# Patient Record
Sex: Female | Born: 1985 | Race: White | Hispanic: No | Marital: Married | Smoking: Never smoker
Health system: Southern US, Community
[De-identification: ages and names within clinical notes are randomized; demographics above are authoritative.]

## PROBLEM LIST (undated history)

## (undated) ENCOUNTER — Inpatient Hospital Stay (HOSPITAL_COMMUNITY): Payer: Self-pay

## (undated) DIAGNOSIS — R112 Nausea with vomiting, unspecified: Secondary | ICD-10-CM

## (undated) DIAGNOSIS — O1400 Mild to moderate pre-eclampsia, unspecified trimester: Secondary | ICD-10-CM

## (undated) DIAGNOSIS — Z9889 Other specified postprocedural states: Secondary | ICD-10-CM

## (undated) DIAGNOSIS — F419 Anxiety disorder, unspecified: Secondary | ICD-10-CM

## (undated) DIAGNOSIS — O139 Gestational [pregnancy-induced] hypertension without significant proteinuria, unspecified trimester: Secondary | ICD-10-CM

## (undated) HISTORY — PX: NOSE SURGERY: SHX723

## (undated) HISTORY — PX: BREAST REDUCTION SURGERY: SHX8

## (undated) HISTORY — PX: SHOULDER SURGERY: SHX246

## (undated) HISTORY — PX: WISDOM TOOTH EXTRACTION: SHX21

## (undated) SURGERY — Surgical Case
Anesthesia: *Unknown

---

## 2003-10-08 ENCOUNTER — Other Ambulatory Visit: Admission: RE | Admit: 2003-10-08 | Discharge: 2003-10-08 | Payer: Self-pay | Admitting: Internal Medicine

## 2005-03-16 ENCOUNTER — Other Ambulatory Visit: Admission: RE | Admit: 2005-03-16 | Discharge: 2005-03-16 | Payer: Self-pay | Admitting: Internal Medicine

## 2006-01-04 ENCOUNTER — Encounter: Admission: RE | Admit: 2006-01-04 | Discharge: 2006-01-04 | Payer: Self-pay | Admitting: Family Medicine

## 2007-07-12 ENCOUNTER — Other Ambulatory Visit: Admission: RE | Admit: 2007-07-12 | Discharge: 2007-07-12 | Payer: Self-pay | Admitting: Obstetrics and Gynecology

## 2008-07-28 ENCOUNTER — Other Ambulatory Visit: Admission: RE | Admit: 2008-07-28 | Discharge: 2008-07-28 | Payer: Self-pay | Admitting: Obstetrics and Gynecology

## 2009-01-14 ENCOUNTER — Inpatient Hospital Stay (HOSPITAL_COMMUNITY): Admission: AD | Admit: 2009-01-14 | Discharge: 2009-01-14 | Payer: Self-pay | Admitting: Obstetrics and Gynecology

## 2009-03-10 ENCOUNTER — Inpatient Hospital Stay (HOSPITAL_COMMUNITY): Admission: AD | Admit: 2009-03-10 | Discharge: 2009-03-10 | Payer: Self-pay | Admitting: Obstetrics and Gynecology

## 2009-04-05 ENCOUNTER — Inpatient Hospital Stay (HOSPITAL_COMMUNITY): Admission: AD | Admit: 2009-04-05 | Discharge: 2009-04-08 | Payer: Self-pay | Admitting: Obstetrics and Gynecology

## 2010-07-04 ENCOUNTER — Encounter: Payer: Self-pay | Admitting: Family Medicine

## 2010-09-16 LAB — COMPREHENSIVE METABOLIC PANEL
BUN: 6 mg/dL (ref 6–23)
CO2: 22 mEq/L (ref 19–32)
Calcium: 9.4 mg/dL (ref 8.4–10.5)
Creatinine, Ser: 0.67 mg/dL (ref 0.4–1.2)
GFR calc Af Amer: >60 mL/min (ref >60)
GFR calc non Af Amer: >60 mL/min (ref >60)
Sodium: 136 mEq/L (ref 135–145)

## 2010-09-16 LAB — CBC
Hemoglobin: 12.7 g/dL (ref 12.0–15.0)
MCV: 92.6 fL (ref 78.0–100.0)
MCV: 93.3 fL (ref 78.0–100.0)
RBC: 4.09 MIL/uL (ref 3.87–5.11)
WBC: 8.6 10*3/uL (ref 4.0–10.5)

## 2012-01-24 ENCOUNTER — Inpatient Hospital Stay (HOSPITAL_COMMUNITY)
Admission: AD | Admit: 2012-01-24 | Discharge: 2012-01-24 | Disposition: A | Discharge status: Home / Self Care | Payer: 59 | Source: Ambulatory Visit | Attending: Obstetrics and Gynecology | Admitting: Obstetrics and Gynecology

## 2012-01-24 ENCOUNTER — Ambulatory Visit (HOSPITAL_COMMUNITY): Payer: 59

## 2012-01-24 ENCOUNTER — Encounter (HOSPITAL_COMMUNITY): Payer: Self-pay | Admitting: *Deleted

## 2012-01-24 DIAGNOSIS — O99891 Other specified diseases and conditions complicating pregnancy: Secondary | ICD-10-CM | POA: Insufficient documentation

## 2012-01-24 DIAGNOSIS — R1031 Right lower quadrant pain: Secondary | ICD-10-CM | POA: Insufficient documentation

## 2012-01-24 DIAGNOSIS — K37 Unspecified appendicitis: Secondary | ICD-10-CM

## 2012-01-24 HISTORY — DX: Gestational (pregnancy-induced) hypertension without significant proteinuria, unspecified trimester: O13.9

## 2012-01-24 LAB — URINALYSIS, ROUTINE W REFLEX MICROSCOPIC
Ketones, ur: NEGATIVE mg/dL
Leukocytes, UA: NEGATIVE
Nitrite: NEGATIVE
Protein, ur: NEGATIVE mg/dL
pH: 5.5 (ref 5.0–8.0)

## 2012-01-24 LAB — CBC WITH DIFFERENTIAL/PLATELET
Basophils Absolute: 0 10*3/uL (ref 0.0–0.1)
Eosinophils Relative: 0 % (ref 0–5)
Hemoglobin: 13.3 g/dL (ref 12.0–15.0)
Lymphocytes Relative: 4 % — ABNORMAL LOW (ref 12–46)
Lymphs Abs: 0.7 10*3/uL (ref 0.7–4.0)
MCH: 31.1 pg (ref 26.0–34.0)
MCHC: 36 g/dL (ref 30.0–36.0)
MCV: 86.4 fL (ref 78.0–100.0)
Monocytes Absolute: 0.8 10*3/uL (ref 0.1–1.0)
Neutrophils Relative %: 91 % — ABNORMAL HIGH (ref 43–77)
RBC: 4.27 MIL/uL (ref 3.87–5.11)
WBC: 15.7 10*3/uL — ABNORMAL HIGH (ref 4.0–10.5)

## 2012-01-24 LAB — COMPREHENSIVE METABOLIC PANEL
ALT: 10 U/L (ref 0–35)
BUN: 8 mg/dL (ref 6–23)
Calcium: 10.1 mg/dL (ref 8.4–10.5)
Potassium: 3.8 mEq/L (ref 3.5–5.1)
Total Bilirubin: 0.4 mg/dL (ref 0.3–1.2)

## 2012-01-24 LAB — POCT PREGNANCY, URINE: Preg Test, Ur: POSITIVE — AB

## 2012-01-24 MED ORDER — ONDANSETRON HCL 4 MG/2ML IJ SOLN
4.0000 mg | Freq: Once | INTRAMUSCULAR | Status: AC
  Administered 2012-01-24: 4 mg via INTRAVENOUS
  Filled 2012-01-24: qty 2

## 2012-01-24 MED ORDER — LACTATED RINGERS IV SOLN
INTRAVENOUS | Status: DC
  Administered 2012-01-24: 09:00:00 via INTRAVENOUS

## 2012-01-24 MED ORDER — HYDROMORPHONE HCL PF 1 MG/ML IJ SOLN
1.0000 mg | INTRAMUSCULAR | Status: DC | PRN
  Administered 2012-01-24: 1 mg via INTRAVENOUS
  Filled 2012-01-24: qty 1

## 2012-01-24 NOTE — Progress Notes (Signed)
I have reviewed the MAU note by CNM. C/o severe RLQ pain since about 0400, n/v, chills. She is afebrile but obviously uncomfortable Abd-soft, tender with rebound in RLQ IUP at 9+ weeks, RLQ pain suspicious for appendicitis.  I have spoken with Dr. Jamey Ripa and Dr. Eppie Gibson.  Will get CBC, CMP, send for MRI to look at appendix.  IV Dilaudid prn for now.

## 2012-01-24 NOTE — MAU Note (Signed)
Somewhat nauseated, had been constipated, did go prior to onset.

## 2012-01-24 NOTE — MAU Provider Note (Signed)
Hannah Hill y.o.G2P1001 @[redacted]w[redacted]d  by LMP Chief Complaint  Patient presents with  . Abdominal Pain     First Provider Initiated Contact with Patient 01/24/12 0720      SUBJECTIVE  HPI: Pt presents to MAU with RLQ sharp pain and diffuse constant pain radiating from RLQ across lower abdomen.  She woke up at midnight with indigestion severe enough she could not sleep, took tums, and tried to use the restroom.  Her pain became progressively worse over the next few hours despite trying a bath and Pepcid.  Upon arrival in MAU, she reports nausea and chills and rates her abdominal pain 9/10.  She has had one prenatal visit in this pregnancy with a  confirmation ultrasound which she reports was normal 1 week ago. She denies vaginal bleeding, vaginal itching/burning, urinary symptoms, h/a, or dizziness.  Past Medical History  Diagnosis Date  . Pregnancy induced hypertension    Past Surgical History  Procedure Date  . Wisdom tooth extraction   . Nose surgery    History   Social History  . Marital Status: Married    Spouse Name: N/A    Number of Children: N/A  . Years of Education: N/A   Occupational History  . Not on file.   Social History Main Topics  . Smoking status: Never Smoker   . Smokeless tobacco: Not on file  . Alcohol Use:   . Drug Use: No  . Sexually Active: Yes   Other Topics Concern  . Not on file   Social History Narrative  . No narrative on file   No current facility-administered medications on file prior to encounter.   Current Outpatient Prescriptions on File Prior to Encounter  Medication Sig Dispense Refill  . famotidine (PEPCID) 20 MG tablet Take 20 mg by mouth 2 (two) times daily.       Allergies  Allergen Reactions  . Amoxicillin Anaphylaxis  . Betadine (Povidone Iodine) Rash    ROS: Pertinent items in HPI  OBJECTIVE Blood pressure 125/76, pulse 89, temperature 98.2 F (36.8 C), temperature source Oral, resp. rate 20, height 5\' 4"  (1.626 m),  weight 63.504 kg (140 lb).  GENERAL: Well-developed, well-nourished female in no acute distress.  HEENT: Normocephalic, good dentition HEART: normal rate RESP: normal effort ABDOMEN: Soft, gravid, negative guarding, positive rebound tenderness in RLQ, positive mild tenderness generalized across lower abdomen EXTREMITIES: Nontender, no edema NEURO: Alert and oriented  Neg CVA tenderness  LAB RESULTS  Results for orders placed during the hospital encounter of 01/24/12 (from the past 24 hour(s))  URINALYSIS, ROUTINE W REFLEX MICROSCOPIC     Status: Abnormal   Collection Time   01/24/12  6:40 AM      Component Value Range   Color, Urine YELLOW  YELLOW   APPearance CLEAR  CLEAR   Specific Gravity, Urine >1.030 (*) 1.005 - 1.030   pH 5.5  5.0 - 8.0   Glucose, UA NEGATIVE  NEGATIVE mg/dL   Hgb urine dipstick NEGATIVE  NEGATIVE   Bilirubin Urine NEGATIVE  NEGATIVE   Ketones, ur NEGATIVE  NEGATIVE mg/dL   Protein, ur NEGATIVE  NEGATIVE mg/dL   Urobilinogen, UA 0.2  0.0 - 1.0 mg/dL   Nitrite NEGATIVE  NEGATIVE   Leukocytes, UA NEGATIVE  NEGATIVE  POCT PREGNANCY, URINE     Status: Abnormal   Collection Time   01/24/12  7:00 AM      Component Value Range   Preg Test, Ur POSITIVE (*) NEGATIVE  Medication List  As of 01/24/2012  7:28 AM   ASK your doctor about these medications         calcium carbonate 500 MG chewable tablet   Commonly known as: TUMS - dosed in mg elemental calcium   Chew 2 tablets by mouth 3 (three) times daily.      famotidine 20 MG tablet   Commonly known as: PEPCID   Take 20 mg by mouth 2 (two) times daily.      PRENATAL VITAMIN PO   Take by mouth.            Called Dr Jackelyn Knife to discuss assessment and findings Care assumed by Dr Jackelyn Knife at this time  LEFTWICH-KIRBY, [facility] 01/24/2012 7:28 AM

## 2012-01-24 NOTE — MAU Note (Signed)
Return via [facility]link from [facility].   Reviewed results.  Pt throwing up when arrived, pt states had thrown up during the night; upon arrival to radiology and now.

## 2012-01-24 NOTE — MAU Note (Signed)
Has had confirmation (IUP) preg.  Pain is in lower quadrant- up to midline.  Did have something like this with first pregnancy, not this severe.

## 2012-01-24 NOTE — MAU Note (Signed)
Pt and spouse aware of need for transfer to [facility] for MRI.  Dr Jackelyn Knife has explained that they will read the test, if has appendicitis she will stay at [facility], if it is negative she will return to [facility]. Pt and spouse verbalized understanding of plan.

## 2012-01-24 NOTE — MAU Note (Signed)
Abdominal pain that started around midnight. Started feeling like indigestion but now its just sharp pain. Feeling like the need to vomit. Muscle aches and chills.

## 2012-02-07 LAB — OB RESULTS CONSOLE GC/CHLAMYDIA: Chlamydia: NEGATIVE

## 2012-06-13 NOTE — L&D Delivery Note (Signed)
Delivery Note At 8:46 PM a viable and healthy female was delivered via Vaginal, Spontaneous Delivery (Presentation:OA, ROT ).  APGAR:8 ,9 ; weight P.   Placenta status:delivered, intact.  Cord: 3VC with the following complications: no.    Anesthesia: Epidural  Episiotomy: no Lacerations: 2nd degree perineal Suture Repair: 3.0 vicryl rapide Est. Blood Loss (mL): 400cc  Mom to postpartum.  Baby to stay with mommy and daddy.  BOVARD,Brysun Eschmann 08/08/2012, 9:16 PM  Br/O+/IUD  D/w pt circumcision for female infant inc r/b/a, wish to proceed

## 2012-08-07 ENCOUNTER — Other Ambulatory Visit: Payer: Self-pay | Admitting: Obstetrics and Gynecology

## 2012-08-07 ENCOUNTER — Encounter (HOSPITAL_COMMUNITY): Payer: Self-pay

## 2012-08-07 DIAGNOSIS — O1400 Mild to moderate pre-eclampsia, unspecified trimester: Secondary | ICD-10-CM

## 2012-08-07 HISTORY — DX: Mild to moderate pre-eclampsia, unspecified trimester: O14.00

## 2012-08-07 MED ORDER — PRENATAL MULTIVITAMIN CH
1.0000 | ORAL_TABLET | Freq: Every day | ORAL | Status: DC
  Filled 2012-08-07: qty 1

## 2012-08-07 MED ORDER — FAMOTIDINE 20 MG PO TABS
20.0000 mg | ORAL_TABLET | Freq: Every day | ORAL | Status: DC
  Administered 2012-08-09 – 2012-08-10 (×2): 20 mg via ORAL
  Filled 2012-08-07 (×2): qty 1

## 2012-08-07 MED ORDER — CALCIUM CARBONATE ANTACID 500 MG PO CHEW: 2.0000 | CHEWABLE_TABLET | Freq: Every day | ORAL | Status: DC | PRN
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
## 2012-08-08 ENCOUNTER — Inpatient Hospital Stay (HOSPITAL_COMMUNITY)
Admission: RE | Admit: 2012-08-08 | Discharge: 2012-08-10 | DRG: 774 | Disposition: A | Discharge status: Home / Self Care | Payer: 59 | Source: Ambulatory Visit | Attending: Obstetrics and Gynecology | Admitting: Obstetrics and Gynecology

## 2012-08-08 ENCOUNTER — Inpatient Hospital Stay (HOSPITAL_COMMUNITY): Payer: 59 | Admitting: Anesthesiology

## 2012-08-08 ENCOUNTER — Encounter (HOSPITAL_COMMUNITY): Payer: Self-pay

## 2012-08-08 ENCOUNTER — Encounter (HOSPITAL_COMMUNITY): Payer: Self-pay | Admitting: Anesthesiology

## 2012-08-08 VITALS — BP 104/70 | HR 81 | Temp 97.8°F | Resp 18 | Ht 64.0 in | Wt 185.0 lb

## 2012-08-08 DIAGNOSIS — O1400 Mild to moderate pre-eclampsia, unspecified trimester: Secondary | ICD-10-CM | POA: Diagnosis not present

## 2012-08-08 DIAGNOSIS — IMO0002 Reserved for concepts with insufficient information to code with codable children: Principal | ICD-10-CM | POA: Diagnosis present

## 2012-08-08 HISTORY — DX: Mild to moderate pre-eclampsia, unspecified trimester: O14.00

## 2012-08-08 HISTORY — DX: Nausea with vomiting, unspecified: Z98.890

## 2012-08-08 HISTORY — DX: Other specified postprocedural states: Z98.890

## 2012-08-08 HISTORY — DX: Other specified postprocedural states: R11.2

## 2012-08-08 LAB — CBC
HCT: 34 % — ABNORMAL LOW (ref 36.0–46.0)
HCT: 33.4 % — ABNORMAL LOW (ref 36.0–46.0)
Hemoglobin: 11.6 g/dL — ABNORMAL LOW (ref 12.0–15.0)
Hemoglobin: 11.2 g/dL — ABNORMAL LOW (ref 12.0–15.0)
MCH: 28.9 pg (ref 26.0–34.0)
MCHC: 33.7 g/dL (ref 30.0–36.0)
MCV: 86.1 fL (ref 78.0–100.0)
RBC: 4 MIL/uL (ref 3.87–5.11)
RBC: 3.88 MIL/uL (ref 3.87–5.11)
RDW: 12.7 % (ref 11.5–15.5)
WBC: 7.4 10*3/uL (ref 4.0–10.5)
WBC: 8.6 10*3/uL (ref 4.0–10.5)

## 2012-08-08 LAB — COMPREHENSIVE METABOLIC PANEL
CO2: 20 mEq/L (ref 19–32)
Calcium: 9 mg/dL (ref 8.4–10.5)
Creatinine, Ser: 0.5 mg/dL (ref 0.50–1.10)
GFR calc Af Amer: >90 mL/min (ref >90)
GFR calc non Af Amer: >90 mL/min (ref >90)
Glucose, Bld: 69 mg/dL — ABNORMAL LOW (ref 70–99)
Total Protein: 6.5 g/dL (ref 6.0–8.3)

## 2012-08-08 LAB — OB RESULTS CONSOLE GBS: GBS: NEGATIVE

## 2012-08-08 LAB — OB RESULTS CONSOLE ANTIBODY SCREEN: Antibody Screen: NEGATIVE

## 2012-08-08 LAB — RPR: RPR Ser Ql: NONREACTIVE

## 2012-08-08 MED ORDER — TETANUS-DIPHTH-ACELL PERTUSSIS 5-2.5-18.5 LF-MCG/0.5 IM SUSP: 0.5000 mL | Freq: Once | INTRAMUSCULAR | Status: DC

## 2012-08-08 MED ORDER — SENNOSIDES-DOCUSATE SODIUM 8.6-50 MG PO TABS
2.0000 | ORAL_TABLET | Freq: Every day | ORAL | Status: DC
  Administered 2012-08-09: 2 via ORAL

## 2012-08-08 MED ORDER — EPHEDRINE 5 MG/ML INJ
10.0000 mg | INTRAVENOUS | Status: DC | PRN
  Filled 2012-08-08: qty 4

## 2012-08-08 MED ORDER — LACTATED RINGERS IV SOLN: INTRAVENOUS | Status: DC

## 2012-08-08 MED ORDER — PHENYLEPHRINE 40 MCG/ML (10ML) SYRINGE FOR IV PUSH (FOR BLOOD PRESSURE SUPPORT): 80.0000 ug | PREFILLED_SYRINGE | INTRAVENOUS | Status: DC | PRN

## 2012-08-08 MED ORDER — ACETAMINOPHEN 325 MG PO TABS: 650.0000 mg | ORAL_TABLET | ORAL | Status: DC | PRN

## 2012-08-08 MED ORDER — EPHEDRINE 5 MG/ML INJ: 10.0000 mg | INTRAVENOUS | Status: DC | PRN

## 2012-08-08 MED ORDER — IBUPROFEN 600 MG PO TABS
600.0000 mg | ORAL_TABLET | Freq: Four times a day (QID) | ORAL | Status: DC
  Administered 2012-08-09 – 2012-08-10 (×6): 600 mg via ORAL
  Filled 2012-08-08 (×6): qty 1

## 2012-08-08 MED ORDER — ONDANSETRON HCL 4 MG PO TABS: 4.0000 mg | ORAL_TABLET | ORAL | Status: DC | PRN

## 2012-08-08 MED ORDER — LIDOCAINE HCL (PF) 1 % IJ SOLN
INTRAMUSCULAR | Status: DC | PRN
  Administered 2012-08-08 (×2): 5 mL

## 2012-08-08 MED ORDER — BUTORPHANOL TARTRATE 1 MG/ML IJ SOLN
2.0000 mg | INTRAMUSCULAR | Status: DC | PRN
  Administered 2012-08-08: 2 mg via INTRAVENOUS
  Filled 2012-08-08: qty 2

## 2012-08-08 MED ORDER — SIMETHICONE 80 MG PO CHEW: 80.0000 mg | CHEWABLE_TABLET | ORAL | Status: DC | PRN

## 2012-08-08 MED ORDER — LACTATED RINGERS IV SOLN
500.0000 mL | Freq: Once | INTRAVENOUS | Status: AC
  Administered 2012-08-08: 500 mL via INTRAVENOUS

## 2012-08-08 MED ORDER — ONDANSETRON HCL 4 MG/2ML IJ SOLN: 4.0000 mg | INTRAMUSCULAR | Status: DC | PRN

## 2012-08-08 MED ORDER — DIBUCAINE 1 % RE OINT
1.0000 "application " | TOPICAL_OINTMENT | RECTAL | Status: DC | PRN
  Administered 2012-08-10: 1 via RECTAL
  Filled 2012-08-08: qty 28

## 2012-08-08 MED ORDER — ONDANSETRON HCL 4 MG/2ML IJ SOLN
4.0000 mg | Freq: Four times a day (QID) | INTRAMUSCULAR | Status: DC | PRN
  Administered 2012-08-08: 4 mg via INTRAVENOUS
  Filled 2012-08-08: qty 2

## 2012-08-08 MED ORDER — SODIUM CHLORIDE 0.9 % IV SOLN
INTRAVENOUS | Status: DC
  Filled 2012-08-08 (×3): qty 25

## 2012-08-08 MED ORDER — IBUPROFEN 600 MG PO TABS: 600.0000 mg | ORAL_TABLET | Freq: Four times a day (QID) | ORAL | Status: DC | PRN

## 2012-08-08 MED ORDER — WITCH HAZEL-GLYCERIN EX PADS
1.0000 "application " | MEDICATED_PAD | CUTANEOUS | Status: DC | PRN
  Administered 2012-08-10: 1 via TOPICAL

## 2012-08-08 MED ORDER — DIPHENHYDRAMINE HCL 50 MG/ML IJ SOLN: 12.5000 mg | INTRAMUSCULAR | Status: DC | PRN

## 2012-08-08 MED ORDER — LIDOCAINE HCL (PF) 1 % IJ SOLN
30.0000 mL | INTRAMUSCULAR | Status: DC | PRN
  Filled 2012-08-08: qty 30

## 2012-08-08 MED ORDER — BENZOCAINE-MENTHOL 20-0.5 % EX AERO
1.0000 "application " | INHALATION_SPRAY | CUTANEOUS | Status: DC | PRN
  Administered 2012-08-09: 1 via TOPICAL
  Filled 2012-08-08: qty 56

## 2012-08-08 MED ORDER — PHENYLEPHRINE 40 MCG/ML (10ML) SYRINGE FOR IV PUSH (FOR BLOOD PRESSURE SUPPORT)
80.0000 ug | PREFILLED_SYRINGE | INTRAVENOUS | Status: DC | PRN
  Filled 2012-08-08: qty 5

## 2012-08-08 MED ORDER — DIPHENHYDRAMINE HCL 25 MG PO CAPS: 25.0000 mg | ORAL_CAPSULE | Freq: Four times a day (QID) | ORAL | Status: DC | PRN

## 2012-08-08 MED ORDER — OXYTOCIN 40 UNITS IN LACTATED RINGERS INFUSION - SIMPLE MED: 62.5000 mL/h | INTRAVENOUS | Status: DC

## 2012-08-08 MED ORDER — PRENATAL MULTIVITAMIN CH
1.0000 | ORAL_TABLET | Freq: Every day | ORAL | Status: DC
  Administered 2012-08-09 – 2012-08-10 (×2): 1 via ORAL
  Filled 2012-08-08: qty 1

## 2012-08-08 MED ORDER — OXYCODONE-ACETAMINOPHEN 5-325 MG PO TABS: 1.0000 | ORAL_TABLET | ORAL | Status: DC | PRN

## 2012-08-08 MED ORDER — LACTATED RINGERS IV SOLN
INTRAVENOUS | Status: DC
  Administered 2012-08-08 (×2): via INTRAVENOUS

## 2012-08-08 MED ORDER — FENTANYL 2.5 MCG/ML BUPIVACAINE 1/10 % EPIDURAL INFUSION (WH - ANES)
14.0000 mL/h | INTRAMUSCULAR | Status: DC
  Administered 2012-08-08 (×2): 14 mL/h via EPIDURAL
  Filled 2012-08-08: qty 125

## 2012-08-08 MED ORDER — ZOLPIDEM TARTRATE 5 MG PO TABS: 5.0000 mg | ORAL_TABLET | Freq: Every evening | ORAL | Status: DC | PRN

## 2012-08-08 MED ORDER — LANOLIN HYDROUS EX OINT: TOPICAL_OINTMENT | CUTANEOUS | Status: DC | PRN

## 2012-08-08 MED ORDER — TERBUTALINE SULFATE 1 MG/ML IJ SOLN: 0.2500 mg | Freq: Once | INTRAMUSCULAR | Status: DC | PRN

## 2012-08-08 MED ORDER — LACTATED RINGERS IV SOLN: 500.0000 mL | INTRAVENOUS | Status: DC | PRN

## 2012-08-08 MED ORDER — OXYTOCIN BOLUS FROM INFUSION: 500.0000 mL | INTRAVENOUS | Status: DC

## 2012-08-08 MED ORDER — OXYTOCIN 40 UNITS IN LACTATED RINGERS INFUSION - SIMPLE MED
1.0000 m[IU]/min | INTRAVENOUS | Status: DC
  Administered 2012-08-08: 2 m[IU]/min via INTRAVENOUS
  Filled 2012-08-08: qty 1000

## 2012-08-08 MED ORDER — CITRIC ACID-SODIUM CITRATE 334-500 MG/5ML PO SOLN: 30.0000 mL | ORAL | Status: DC | PRN

## 2012-08-08 MED ORDER — OXYCODONE-ACETAMINOPHEN 5-325 MG PO TABS
1.0000 | ORAL_TABLET | ORAL | Status: DC | PRN
  Administered 2012-08-09 – 2012-08-10 (×5): 1 via ORAL
  Filled 2012-08-08 (×3): qty 1
  Filled 2012-08-08: qty 2
  Filled 2012-08-08: qty 1

## 2012-08-08 NOTE — Progress Notes (Signed)
Patient ID: Hannah Hill, female   DOB: 10-04-1985, 27 y.o.   MRN: [id number]  H&P reviewed, no changes  AFVSS, BP 140-150/80-90 gen NAD FHTs 130's mod var toco q 4-46min SVE 1.5/50 -2-3 Unable to ROM, despite attempt  27yo G2P1001 with mild PreE for iol Will follow CBC/CMP Epidural prn AROM/pitocin to augment Expect SVD

## 2012-08-08 NOTE — Anesthesia Preprocedure Evaluation (Signed)
Anesthesia Evaluation  Patient identified by MRN, date of birth, ID band Patient awake    Reviewed: Allergy & Precautions, H&P , Patient's Chart, lab work & pertinent test results  History of Anesthesia Complications (+) PONV  Airway Mallampati: II TM Distance: >3 FB Neck ROM: full    Dental no notable dental hx.    Pulmonary neg pulmonary ROS,  breath sounds clear to auscultation  Pulmonary exam normal       Cardiovascular hypertension, negative cardio ROS  Rhythm:regular Rate:Normal     Neuro/Psych negative neurological ROS  negative psych ROS   GI/Hepatic negative GI ROS, Neg liver ROS,   Endo/Other  negative endocrine ROS  Renal/GU negative Renal ROS     Musculoskeletal   Abdominal   Peds  Hematology negative hematology ROS (+)   Anesthesia Other Findings PONV (postoperative nausea and vomiting)     Pregnancy induced hypertension        Mild preeclampsia    Reproductive/Obstetrics (+) Pregnancy                           Anesthesia Physical Anesthesia Plan  ASA: III  Anesthesia Plan: Epidural   Post-op Pain Management:    Induction:   Airway Management Planned:   Additional Equipment:   Intra-op Plan:   Post-operative Plan:   Informed Consent: I have reviewed the patients History and Physical, chart, labs and discussed the procedure including the risks, benefits and alternatives for the proposed anesthesia with the patient or authorized representative who has indicated his/her understanding and acceptance.     Plan Discussed with:   Anesthesia Plan Comments:         Anesthesia Quick Evaluation

## 2012-08-08 NOTE — Anesthesia Procedure Notes (Signed)
Epidural Patient location during procedure: OB Start time: 08/08/2012 5:51 PM  Staffing Anesthesiologist: Angus Seller., Harrell Gave. Performed by: anesthesiologist   Preanesthetic Checklist Completed: patient identified, site marked, surgical consent, pre-op evaluation, timeout performed, IV checked, risks and benefits discussed and monitors and equipment checked  Epidural Patient position: sitting Prep: ChloraPrep and site prepped and draped Patient monitoring: continuous pulse ox and blood pressure Approach: midline Injection technique: LOR air and LOR saline  Needle:  Needle type: Tuohy  Needle gauge: 17 G Needle length: 9 cm and 9 Needle insertion depth: 5 cm cm Catheter type: closed end flexible Catheter size: 19 Gauge Catheter at skin depth: 10 cm Test dose: negative  Assessment Events: blood not aspirated, injection not painful, no injection resistance, negative IV test and no paresthesia  Additional Notes Patient identified.  Risk benefits discussed including failed block, incomplete pain control, headache, nerve damage, paralysis, blood pressure changes, nausea, vomiting, reactions to medication both toxic or allergic, and postpartum back pain.  Patient expressed understanding and wished to proceed.  All questions were answered.  Sterile technique used throughout procedure and epidural site dressed with sterile barrier dressing. No paresthesia or other complications noted.The patient did not experience any signs of intravascular injection such as tinnitus or metallic taste in mouth nor signs of intrathecal spread such as rapid motor block. Please see nursing notes for vital signs.

## 2012-08-08 NOTE — Progress Notes (Signed)
Patient ID: Hannah Hill, female   DOB: [birth year], 27 y.o.   MRN: [id number]  Feeling ctx.  Doing well  AFVSS 130's/90's  FHTs 150's good var, category 1 toco q 2-3 min  AROM for clear fluid SVE 3.4/70/-2  Continue current mgmt Augment with pitocin Expect SVD Monitor BP - if have to treat, will treat with magnesium CBC q 6hr/ CMP

## 2012-08-09 LAB — CBC
MCH: 28.8 pg (ref 26.0–34.0)
MCV: 86.2 fL (ref 78.0–100.0)
Platelets: 163 10*3/uL (ref 150–400)
RDW: 12.9 % (ref 11.5–15.5)

## 2012-08-09 LAB — CCBB MATERNAL DONOR DRAW

## 2012-08-09 NOTE — Anesthesia Postprocedure Evaluation (Signed)
  Anesthesia Post-op Note  Patient: Hannah Hill  Procedure(s) Performed: * No procedures listed *  Patient Location: Mother/Baby  Anesthesia Type:Epidural  Level of Consciousness: awake  Airway and Oxygen Therapy: Patient Spontanous Breathing  Post-op Pain: none  Post-op Assessment: Patient's Cardiovascular Status Stable, Respiratory Function Stable, Patent Airway, No signs of Nausea or vomiting, Adequate PO intake, Pain level controlled, No headache, No backache, No residual numbness and No residual motor weakness  Post-op Vital Signs: Reviewed and stable  Complications: No apparent anesthesia complications

## 2012-08-09 NOTE — Progress Notes (Signed)
Post Partum Day 1 Subjective: no complaints, up ad lib, tolerating PO and nl lochia, pain controlled  Objective: Blood pressure 102/65, pulse 76, temperature 98.2 F (36.8 C), temperature source Oral, resp. rate 18, height 5\' 4"  (1.626 m), weight 83.915 kg (185 lb), SpO2 99.00%, unknown if currently breastfeeding.  Physical Exam:  General: alert and no distress Lochia: appropriate Uterine Fundus: firm    Recent Labs  08/08/12 1322 08/08/12 2000  HGB 11.6* 11.2*  HCT 34.0* 33.4*    Assessment/Plan: Plan for discharge tomorrow, Breastfeeding and Lactation consult.  Routine PP care.     LOS: 1 day   BOVARD,Jami Ohlin 08/09/2012, 6:24 AM

## 2012-08-10 MED ORDER — OXYCODONE-ACETAMINOPHEN 5-325 MG PO TABS: 1.0000 | ORAL_TABLET | Freq: Four times a day (QID) | ORAL | Status: DC | PRN

## 2012-08-10 MED ORDER — IBUPROFEN 800 MG PO TABS: 800.0000 mg | ORAL_TABLET | Freq: Four times a day (QID) | ORAL | Status: DC

## 2012-08-10 MED ORDER — PRENATAL MULTIVITAMIN CH: 1.0000 | ORAL_TABLET | Freq: Every day | ORAL | Status: AC

## 2012-08-10 NOTE — Discharge Summary (Signed)
Obstetric Discharge Summary Reason for Admission: induction of labor and PreE Prenatal Procedures: none Intrapartum Procedures: spontaneous vaginal delivery Postpartum Procedures: none Complications-Operative and Postpartum: 2nd degree perineal laceration Hemoglobin  Date Value Range Status  08/09/2012 9.4* 12.0 - 15.0 g/dL Final     HCT  Date Value Range Status  08/09/2012 28.1* 36.0 - 46.0 % Final    Physical Exam:  General: alert and no distress Lochia: appropriate Uterine Fundus: firm  Discharge Diagnoses: Term Pregnancy-delivered  Discharge Information: Date: 08/10/2012 Activity: pelvic rest Diet: routine Medications: PNV, Ibuprofen and Percocet Condition: stable Instructions: refer to practice specific booklet Discharge to: home Follow-up Information   Follow up with BOVARD,Hannah Menon, MD. Schedule an appointment as soon as possible for a visit in 6 weeks.   Contact information:   [address] [place] [address] [phone]       Newborn Data: Live born female  Birth Weight: 7 lb 9 oz (3430 g) APGAR: 8, 9  Home with mother.  BOVARD,Hannah Hill 08/10/2012, 7:52 AM

## 2012-08-10 NOTE — Progress Notes (Signed)
Post Partum Day 2 Subjective: no complaints, up ad lib, voiding, tolerating PO and nl lochia, pain controlled  Objective: Blood pressure 104/70, pulse 81, temperature 97.8 F (36.6 C), temperature source Oral, resp. rate 18, height 5\' 4"  (1.626 m), weight 83.915 kg (185 lb), SpO2 98.00%, unknown if currently breastfeeding.  Physical Exam:  General: alert and no distress Lochia: appropriate Uterine Fundus: firm   Recent Labs  08/08/12 2000 08/09/12 0545  HGB 11.2* 9.4*  HCT 33.4* 28.1*    Assessment/Plan: Discharge home, Breastfeeding and Lactation consult.  D/C with motrin/percocet/pnv.  F/u in 6 weeks   LOS: 2 days   BOVARD,Razan Siler 08/10/2012, 7:47 AM

## 2012-08-10 NOTE — Progress Notes (Signed)
Pt discharged before CSW could assess history of anxiety. 

## 2014-01-08 ENCOUNTER — Emergency Department (INDEPENDENT_AMBULATORY_CARE_PROVIDER_SITE_OTHER)
Admission: EM | Admit: 2014-01-08 | Discharge: 2014-01-08 | Disposition: A | Discharge status: Home / Self Care | Payer: 59 | Source: Home / Self Care | Attending: Family Medicine | Admitting: Family Medicine

## 2014-01-08 ENCOUNTER — Encounter (HOSPITAL_COMMUNITY): Payer: Self-pay | Admitting: Emergency Medicine

## 2014-01-08 DIAGNOSIS — B349 Viral infection, unspecified: Secondary | ICD-10-CM

## 2014-01-08 DIAGNOSIS — B9789 Other viral agents as the cause of diseases classified elsewhere: Secondary | ICD-10-CM

## 2014-01-08 LAB — POCT I-STAT, CHEM 8
BUN: 7 mg/dL (ref 6–23)
CREATININE: 0.7 mg/dL (ref 0.50–1.10)
Calcium, Ion: 1.1 mmol/L — ABNORMAL LOW (ref 1.12–1.23)
Chloride: 105 mEq/L (ref 96–112)
Glucose, Bld: 77 mg/dL (ref 70–99)
HCT: 39 % (ref 36.0–46.0)
Hemoglobin: 13.3 g/dL (ref 12.0–15.0)
POTASSIUM: 3.4 meq/L — AB (ref 3.7–5.3)
SODIUM: 137 meq/L (ref 137–147)
TCO2: 21 mmol/L (ref 0–100)

## 2014-01-08 LAB — POCT URINALYSIS DIP (DEVICE)
BILIRUBIN URINE: NEGATIVE
GLUCOSE, UA: NEGATIVE mg/dL
HGB URINE DIPSTICK: NEGATIVE
KETONES UR: NEGATIVE mg/dL
Leukocytes, UA: NEGATIVE
Nitrite: NEGATIVE
Protein, ur: NEGATIVE mg/dL
SPECIFIC GRAVITY, URINE: 1.025 (ref 1.005–1.030)
Urobilinogen, UA: 0.2 mg/dL (ref 0.0–1.0)
pH: 6 (ref 5.0–8.0)

## 2014-01-08 LAB — POCT PREGNANCY, URINE: Preg Test, Ur: NEGATIVE

## 2014-01-08 MED ORDER — DOXYCYCLINE HYCLATE 100 MG PO CAPS: 100.0000 mg | ORAL_CAPSULE | Freq: Two times a day (BID) | ORAL | Status: DC
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
## 2014-01-08 NOTE — Discharge Instructions (Signed)
Call on fri for rmsf test result, take medicine as prescribed, drink plenty of fluids

## 2014-01-08 NOTE — ED Provider Notes (Signed)
CSN: [id number]     Arrival date & time 01/08/14  1601 History   First MD Initiated Contact with Patient 01/08/14 1623     Chief Complaint  Patient presents with  . Generalized Body Aches   (Consider location/radiation/quality/duration/timing/severity/associated sxs/prior Treatment) Patient is a 28 y.o. female presenting with weakness. The history is provided by the patient.  Weakness This is a new problem. The current episode started more than 2 days ago. The problem has not changed since onset.Associated symptoms include headaches. Pertinent negatives include no shortness of breath.    Past Medical History  Diagnosis Date  . Pregnancy induced hypertension   . Mild preeclampsia 08/07/2012  . PONV (postoperative nausea and vomiting)    Past Surgical History  Procedure Laterality Date  . Wisdom tooth extraction    . Nose surgery     Family History  Problem Relation Age of Onset  . Other Neg Hx    History  Substance Use Topics  . Smoking status: Never Smoker   . Smokeless tobacco: Never Used  . Alcohol Use: Not on file   OB History   Grav Para Term Preterm Abortions TAB SAB Ect Mult Living   2 2 2  0 0 0 0 0 0 2     Review of Systems  Constitutional: Negative for fever.  HENT: Negative for congestion, postnasal drip and rhinorrhea.   Respiratory: Negative for cough and shortness of breath.   Cardiovascular: Negative.   Gastrointestinal: Negative.   Genitourinary: Negative.   Musculoskeletal: Positive for myalgias.  Neurological: Positive for weakness and headaches.    Allergies  Amoxicillin and Betadine  Home Medications   Prior to Admission medications   Medication Sig Start Date End Date Taking? Authorizing Provider  drospirenone-ethinyl estradiol (YAZ,GIANVI,LORYNA) 3-0.02 MG tablet Take 1 tablet by mouth daily.   Yes Historical Provider, MD  doxycycline (VIBRAMYCIN) 100 MG capsule Take 1 capsule (100 mg total) by mouth 2 (two) times daily. 01/08/14   Linna Hoff, MD  ibuprofen (ADVIL,MOTRIN) 800 MG tablet Take 1 tablet (800 mg total) by mouth every 6 (six) hours. 08/10/12   Sherian Rein, MD  oxyCODONE-acetaminophen (PERCOCET/ROXICET) 5-325 MG per tablet Take 1-2 tablets by mouth every 6 (six) hours as needed for pain. 08/10/12   Sherian Rein, MD  oxymetazoline (AFRIN) 0.05 % nasal spray Place 1 spray into the nose as needed for congestion.    Historical Provider, MD  Prenatal Vit-Fe Fumarate-FA (PRENATAL MULTIVITAMIN) TABS Take 1 tablet by mouth daily. 08/10/12   Jody Bovard-Stuckert, MD   BP 126/84  Pulse 69  Temp(Src) 98.5 F (36.9 C) (Oral)  Resp 16  SpO2 100%  LMP 12/18/2013  Breastfeeding? No Physical Exam  Nursing note and vitals reviewed. Constitutional: She is oriented to person, place, and time. She appears well-developed and well-nourished.  HENT:  Head: Normocephalic.  Right Ear: External ear normal.  Left Ear: External ear normal.  Mouth/Throat: Oropharynx is clear and moist.  Neck: Normal range of motion. Neck supple.  Cardiovascular: Normal heart sounds.   Pulmonary/Chest: Effort normal and breath sounds normal.  Lymphadenopathy:    She has no cervical adenopathy.  Neurological: She is alert and oriented to person, place, and time.  Skin: Skin is warm and dry. No rash noted.    ED Course  Procedures (including critical care time) Labs Review Labs Reviewed  POCT I-STAT, CHEM 8 - Abnormal; Notable for the following:    Potassium 3.4 (*)    Calcium, Ion 1.10 (*)  All other components within normal limits  ROCKY MTN SPOTTED FVR AB, IGM-BLOOD  POCT URINALYSIS DIP (DEVICE)  POCT PREGNANCY, URINE    Imaging Review No results found.   MDM   1. Viral illness        Linna HoffJames D Donnika Kucher, MD 01/08/14 [phone]

## 2014-01-09 LAB — ROCKY MTN SPOTTED FVR AB, IGM-BLOOD: RMSF IgM: 0.41 IV (ref 0.00–0.89)

## 2014-01-15 NOTE — ED Notes (Signed)
Discussed labs reports w patietnt; she c/o he is not any better, and is planning to goto her PCP for a more thorough work up

## 2014-04-14 ENCOUNTER — Encounter (HOSPITAL_COMMUNITY): Payer: Self-pay | Admitting: Emergency Medicine

## 2016-06-13 NOTE — L&D Delivery Note (Signed)
Delivery Note Pt with severe variables.  Pt pushed very well with 3-4 contractions for delivery.  At 5:17 PM a viable and healthy female was delivered via Vaginal, Spontaneous Delivery (Presentation: OA; LOT ).  APGAR: 9, 9; weight  P   Placenta status: delivered, intact .  Cord: 3V with the following complications: .  Cord pH: 7.14  Anesthesia: epidural  Episiotomy: None Lacerations:  2nd degree Suture Repair: 3.0 vicryl rapide Est. Blood Loss (mL): 250cc  Mom to postpartum.  Baby to Couplet care / Skin to Skin.  Tanelle Lanzo Bovard-Stuckert 03/22/2017, 5:51 PM  Br/Tdap in PNC/RI/Contra?/O+

## 2017-01-02 ENCOUNTER — Other Ambulatory Visit (HOSPITAL_COMMUNITY): Payer: Self-pay | Admitting: Obstetrics and Gynecology

## 2017-01-02 DIAGNOSIS — O98512 Other viral diseases complicating pregnancy, second trimester: Principal | ICD-10-CM

## 2017-01-02 DIAGNOSIS — B343 Parvovirus infection, unspecified: Secondary | ICD-10-CM

## 2017-01-03 ENCOUNTER — Encounter (HOSPITAL_COMMUNITY): Payer: Self-pay | Admitting: *Deleted

## 2017-01-04 ENCOUNTER — Other Ambulatory Visit (HOSPITAL_COMMUNITY): Payer: Self-pay | Admitting: Obstetrics and Gynecology

## 2017-01-04 ENCOUNTER — Encounter (HOSPITAL_COMMUNITY): Payer: Self-pay

## 2017-01-04 ENCOUNTER — Ambulatory Visit (HOSPITAL_COMMUNITY)
Admission: RE | Admit: 2017-01-04 | Discharge: 2017-01-04 | Disposition: A | Discharge status: Home / Self Care | Payer: Managed Care, Other (non HMO) | Source: Ambulatory Visit | Attending: Obstetrics and Gynecology | Admitting: Obstetrics and Gynecology

## 2017-01-04 DIAGNOSIS — O98513 Other viral diseases complicating pregnancy, third trimester: Secondary | ICD-10-CM | POA: Insufficient documentation

## 2017-01-04 DIAGNOSIS — O99212 Obesity complicating pregnancy, second trimester: Secondary | ICD-10-CM

## 2017-01-04 DIAGNOSIS — B343 Parvovirus infection, unspecified: Secondary | ICD-10-CM

## 2017-01-04 DIAGNOSIS — Z3A28 28 weeks gestation of pregnancy: Secondary | ICD-10-CM

## 2017-01-04 DIAGNOSIS — Z3689 Encounter for other specified antenatal screening: Secondary | ICD-10-CM | POA: Insufficient documentation

## 2017-01-04 DIAGNOSIS — O98512 Other viral diseases complicating pregnancy, second trimester: Principal | ICD-10-CM

## 2017-01-05 ENCOUNTER — Ambulatory Visit (HOSPITAL_COMMUNITY)
Admission: RE | Admit: 2017-01-05 | Discharge: 2017-01-05 | Disposition: A | Discharge status: Home / Self Care | Payer: Self-pay | Source: Ambulatory Visit | Attending: Obstetrics and Gynecology | Admitting: Obstetrics and Gynecology

## 2017-01-05 ENCOUNTER — Other Ambulatory Visit (HOSPITAL_COMMUNITY): Payer: Self-pay | Admitting: *Deleted

## 2017-01-05 DIAGNOSIS — O98519 Other viral diseases complicating pregnancy, unspecified trimester: Principal | ICD-10-CM

## 2017-01-05 DIAGNOSIS — B343 Parvovirus infection, unspecified: Secondary | ICD-10-CM

## 2017-01-06 ENCOUNTER — Ambulatory Visit (HOSPITAL_COMMUNITY): Payer: Self-pay

## 2017-01-11 ENCOUNTER — Ambulatory Visit (HOSPITAL_COMMUNITY)
Admission: RE | Admit: 2017-01-11 | Discharge: 2017-01-11 | Disposition: A | Discharge status: Home / Self Care | Payer: Managed Care, Other (non HMO) | Source: Ambulatory Visit | Attending: Obstetrics and Gynecology | Admitting: Obstetrics and Gynecology

## 2017-01-11 ENCOUNTER — Other Ambulatory Visit (HOSPITAL_COMMUNITY): Payer: Self-pay | Admitting: *Deleted

## 2017-01-11 ENCOUNTER — Encounter (HOSPITAL_COMMUNITY): Payer: Self-pay

## 2017-01-11 ENCOUNTER — Other Ambulatory Visit (HOSPITAL_COMMUNITY): Payer: Self-pay | Admitting: Maternal and Fetal Medicine

## 2017-01-11 DIAGNOSIS — Z3A29 29 weeks gestation of pregnancy: Secondary | ICD-10-CM | POA: Diagnosis not present

## 2017-01-11 DIAGNOSIS — O98519 Other viral diseases complicating pregnancy, unspecified trimester: Secondary | ICD-10-CM | POA: Insufficient documentation

## 2017-01-11 DIAGNOSIS — Z20828 Contact with and (suspected) exposure to other viral communicable diseases: Secondary | ICD-10-CM

## 2017-01-11 DIAGNOSIS — B343 Parvovirus infection, unspecified: Secondary | ICD-10-CM | POA: Insufficient documentation

## 2017-01-11 MED ORDER — BETAMETHASONE SOD PHOS & ACET 6 (3-3) MG/ML IJ SUSP
12.0000 mg | Freq: Once | INTRAMUSCULAR | Status: AC
  Administered 2017-01-11: 12 mg via INTRAMUSCULAR
  Filled 2017-01-11: qty 2

## 2017-01-12 ENCOUNTER — Ambulatory Visit (HOSPITAL_COMMUNITY)
Admission: RE | Admit: 2017-01-12 | Discharge: 2017-01-12 | Disposition: A | Discharge status: Home / Self Care | Payer: Managed Care, Other (non HMO) | Source: Ambulatory Visit | Attending: Obstetrics and Gynecology | Admitting: Obstetrics and Gynecology

## 2017-01-12 DIAGNOSIS — Z3A00 Weeks of gestation of pregnancy not specified: Secondary | ICD-10-CM | POA: Diagnosis not present

## 2017-01-12 DIAGNOSIS — B976 Parvovirus as the cause of diseases classified elsewhere: Secondary | ICD-10-CM | POA: Diagnosis not present

## 2017-01-12 DIAGNOSIS — O98519 Other viral diseases complicating pregnancy, unspecified trimester: Secondary | ICD-10-CM | POA: Diagnosis not present

## 2017-01-12 MED ORDER — BETAMETHASONE SOD PHOS & ACET 6 (3-3) MG/ML IJ SUSP
12.0000 mg | Freq: Once | INTRAMUSCULAR | Status: AC
  Administered 2017-01-12: 12 mg via INTRAMUSCULAR
  Filled 2017-01-12: qty 2

## 2017-01-13 ENCOUNTER — Encounter (HOSPITAL_COMMUNITY): Payer: Self-pay

## 2017-01-13 ENCOUNTER — Ambulatory Visit (HOSPITAL_COMMUNITY)
Admission: RE | Admit: 2017-01-13 | Discharge: 2017-01-13 | Disposition: A | Discharge status: Home / Self Care | Payer: Managed Care, Other (non HMO) | Source: Ambulatory Visit | Attending: Obstetrics and Gynecology | Admitting: Obstetrics and Gynecology

## 2017-01-13 ENCOUNTER — Other Ambulatory Visit (HOSPITAL_COMMUNITY): Payer: Self-pay | Admitting: Maternal and Fetal Medicine

## 2017-01-13 DIAGNOSIS — O98519 Other viral diseases complicating pregnancy, unspecified trimester: Secondary | ICD-10-CM

## 2017-01-13 DIAGNOSIS — Z3A29 29 weeks gestation of pregnancy: Secondary | ICD-10-CM

## 2017-01-13 DIAGNOSIS — Z20828 Contact with and (suspected) exposure to other viral communicable diseases: Secondary | ICD-10-CM

## 2017-01-13 DIAGNOSIS — O98513 Other viral diseases complicating pregnancy, third trimester: Secondary | ICD-10-CM | POA: Insufficient documentation

## 2017-01-18 ENCOUNTER — Encounter (HOSPITAL_COMMUNITY): Payer: Self-pay

## 2017-01-18 ENCOUNTER — Ambulatory Visit (HOSPITAL_COMMUNITY)
Admission: RE | Admit: 2017-01-18 | Discharge: 2017-01-18 | Disposition: A | Discharge status: Home / Self Care | Payer: Managed Care, Other (non HMO) | Source: Ambulatory Visit | Attending: Obstetrics and Gynecology | Admitting: Obstetrics and Gynecology

## 2017-01-18 DIAGNOSIS — O98513 Other viral diseases complicating pregnancy, third trimester: Secondary | ICD-10-CM | POA: Diagnosis not present

## 2017-01-18 DIAGNOSIS — O98519 Other viral diseases complicating pregnancy, unspecified trimester: Secondary | ICD-10-CM | POA: Diagnosis present

## 2017-01-18 DIAGNOSIS — Z3A30 30 weeks gestation of pregnancy: Secondary | ICD-10-CM | POA: Insufficient documentation

## 2017-01-18 DIAGNOSIS — B343 Parvovirus infection, unspecified: Secondary | ICD-10-CM | POA: Diagnosis not present

## 2017-01-25 ENCOUNTER — Ambulatory Visit (HOSPITAL_COMMUNITY)
Admission: RE | Admit: 2017-01-25 | Discharge: 2017-01-25 | Disposition: A | Discharge status: Home / Self Care | Payer: Managed Care, Other (non HMO) | Source: Ambulatory Visit | Attending: Obstetrics and Gynecology | Admitting: Obstetrics and Gynecology

## 2017-01-25 ENCOUNTER — Other Ambulatory Visit (HOSPITAL_COMMUNITY): Payer: Self-pay | Admitting: *Deleted

## 2017-01-25 ENCOUNTER — Encounter (HOSPITAL_COMMUNITY): Payer: Self-pay

## 2017-01-25 DIAGNOSIS — B343 Parvovirus infection, unspecified: Secondary | ICD-10-CM

## 2017-01-25 DIAGNOSIS — O98519 Other viral diseases complicating pregnancy, unspecified trimester: Principal | ICD-10-CM

## 2017-01-25 DIAGNOSIS — Z3A31 31 weeks gestation of pregnancy: Secondary | ICD-10-CM | POA: Insufficient documentation

## 2017-01-25 DIAGNOSIS — O98513 Other viral diseases complicating pregnancy, third trimester: Secondary | ICD-10-CM | POA: Diagnosis present

## 2017-02-03 ENCOUNTER — Other Ambulatory Visit (HOSPITAL_COMMUNITY): Payer: Self-pay | Admitting: Maternal and Fetal Medicine

## 2017-02-03 ENCOUNTER — Encounter (HOSPITAL_COMMUNITY): Payer: Self-pay

## 2017-02-03 ENCOUNTER — Ambulatory Visit (HOSPITAL_COMMUNITY)
Admission: RE | Admit: 2017-02-03 | Discharge: 2017-02-03 | Disposition: A | Discharge status: Home / Self Care | Payer: 59 | Source: Ambulatory Visit | Attending: Obstetrics and Gynecology | Admitting: Obstetrics and Gynecology

## 2017-02-03 DIAGNOSIS — O98519 Other viral diseases complicating pregnancy, unspecified trimester: Principal | ICD-10-CM

## 2017-02-03 DIAGNOSIS — Z3A32 32 weeks gestation of pregnancy: Secondary | ICD-10-CM

## 2017-02-03 DIAGNOSIS — Z362 Encounter for other antenatal screening follow-up: Secondary | ICD-10-CM

## 2017-02-03 DIAGNOSIS — O98513 Other viral diseases complicating pregnancy, third trimester: Secondary | ICD-10-CM | POA: Insufficient documentation

## 2017-02-03 DIAGNOSIS — B343 Parvovirus infection, unspecified: Secondary | ICD-10-CM

## 2017-02-06 ENCOUNTER — Other Ambulatory Visit (HOSPITAL_COMMUNITY): Payer: Self-pay | Admitting: Maternal & Fetal Medicine

## 2017-02-06 ENCOUNTER — Encounter (HOSPITAL_COMMUNITY): Payer: Self-pay

## 2017-02-06 ENCOUNTER — Ambulatory Visit (HOSPITAL_COMMUNITY)
Admission: RE | Admit: 2017-02-06 | Discharge: 2017-02-06 | Disposition: A | Discharge status: Home / Self Care | Payer: 59 | Source: Ambulatory Visit | Attending: Obstetrics and Gynecology | Admitting: Obstetrics and Gynecology

## 2017-02-06 DIAGNOSIS — Z362 Encounter for other antenatal screening follow-up: Secondary | ICD-10-CM | POA: Insufficient documentation

## 2017-02-06 DIAGNOSIS — O98519 Other viral diseases complicating pregnancy, unspecified trimester: Secondary | ICD-10-CM

## 2017-02-06 DIAGNOSIS — Z3A32 32 weeks gestation of pregnancy: Secondary | ICD-10-CM

## 2017-02-06 DIAGNOSIS — O98513 Other viral diseases complicating pregnancy, third trimester: Secondary | ICD-10-CM | POA: Diagnosis present

## 2017-02-08 ENCOUNTER — Ambulatory Visit (HOSPITAL_COMMUNITY)
Admission: RE | Admit: 2017-02-08 | Discharge: 2017-02-08 | Disposition: A | Discharge status: Home / Self Care | Payer: 59 | Source: Ambulatory Visit | Attending: Obstetrics and Gynecology | Admitting: Obstetrics and Gynecology

## 2017-02-08 ENCOUNTER — Encounter (HOSPITAL_COMMUNITY): Payer: Self-pay

## 2017-02-08 DIAGNOSIS — B343 Parvovirus infection, unspecified: Secondary | ICD-10-CM

## 2017-02-08 DIAGNOSIS — Z3A33 33 weeks gestation of pregnancy: Secondary | ICD-10-CM | POA: Diagnosis not present

## 2017-02-08 DIAGNOSIS — O98519 Other viral diseases complicating pregnancy, unspecified trimester: Secondary | ICD-10-CM

## 2017-02-08 DIAGNOSIS — O98513 Other viral diseases complicating pregnancy, third trimester: Secondary | ICD-10-CM | POA: Insufficient documentation

## 2017-02-15 ENCOUNTER — Ambulatory Visit (HOSPITAL_COMMUNITY)
Admission: RE | Admit: 2017-02-15 | Discharge: 2017-02-15 | Disposition: A | Discharge status: Home / Self Care | Payer: 59 | Source: Ambulatory Visit | Attending: Obstetrics and Gynecology | Admitting: Obstetrics and Gynecology

## 2017-02-15 ENCOUNTER — Other Ambulatory Visit (HOSPITAL_COMMUNITY): Payer: Self-pay | Admitting: Maternal and Fetal Medicine

## 2017-02-15 ENCOUNTER — Encounter (HOSPITAL_COMMUNITY): Payer: Self-pay

## 2017-02-15 DIAGNOSIS — O98513 Other viral diseases complicating pregnancy, third trimester: Secondary | ICD-10-CM | POA: Insufficient documentation

## 2017-02-15 DIAGNOSIS — Z362 Encounter for other antenatal screening follow-up: Secondary | ICD-10-CM | POA: Insufficient documentation

## 2017-02-15 DIAGNOSIS — Z3A34 34 weeks gestation of pregnancy: Secondary | ICD-10-CM | POA: Diagnosis not present

## 2017-02-15 DIAGNOSIS — O98519 Other viral diseases complicating pregnancy, unspecified trimester: Secondary | ICD-10-CM

## 2017-02-15 DIAGNOSIS — B343 Parvovirus infection, unspecified: Secondary | ICD-10-CM

## 2017-02-15 NOTE — Addendum Note (Signed)
Encounter addended by: Levonne HubertStalter, Iliana Hutt M on: 02/15/2017 11:43 AM<BR>    Actions taken: Imaging Exam ended

## 2017-02-22 ENCOUNTER — Ambulatory Visit (HOSPITAL_COMMUNITY)
Admission: RE | Admit: 2017-02-22 | Discharge: 2017-02-22 | Disposition: A | Discharge status: Home / Self Care | Payer: 59 | Source: Ambulatory Visit | Attending: Obstetrics and Gynecology | Admitting: Obstetrics and Gynecology

## 2017-02-22 ENCOUNTER — Encounter (HOSPITAL_COMMUNITY): Payer: Self-pay

## 2017-02-22 DIAGNOSIS — O98519 Other viral diseases complicating pregnancy, unspecified trimester: Secondary | ICD-10-CM | POA: Diagnosis present

## 2017-02-22 DIAGNOSIS — O98513 Other viral diseases complicating pregnancy, third trimester: Secondary | ICD-10-CM | POA: Diagnosis not present

## 2017-02-22 DIAGNOSIS — B343 Parvovirus infection, unspecified: Secondary | ICD-10-CM | POA: Diagnosis not present

## 2017-02-22 DIAGNOSIS — Z3A35 35 weeks gestation of pregnancy: Secondary | ICD-10-CM | POA: Diagnosis not present

## 2017-02-24 ENCOUNTER — Other Ambulatory Visit (HOSPITAL_COMMUNITY): Payer: Self-pay | Admitting: *Deleted

## 2017-02-24 DIAGNOSIS — B343 Parvovirus infection, unspecified: Secondary | ICD-10-CM

## 2017-02-24 DIAGNOSIS — O98513 Other viral diseases complicating pregnancy, third trimester: Principal | ICD-10-CM

## 2017-03-01 ENCOUNTER — Encounter (HOSPITAL_COMMUNITY): Payer: Self-pay

## 2017-03-01 ENCOUNTER — Ambulatory Visit (HOSPITAL_COMMUNITY)
Admission: RE | Admit: 2017-03-01 | Discharge: 2017-03-01 | Disposition: A | Discharge status: Home / Self Care | Payer: 59 | Source: Ambulatory Visit | Attending: Obstetrics and Gynecology | Admitting: Obstetrics and Gynecology

## 2017-03-01 DIAGNOSIS — Z3A36 36 weeks gestation of pregnancy: Secondary | ICD-10-CM | POA: Diagnosis not present

## 2017-03-01 DIAGNOSIS — O98513 Other viral diseases complicating pregnancy, third trimester: Secondary | ICD-10-CM | POA: Insufficient documentation

## 2017-03-01 DIAGNOSIS — B343 Parvovirus infection, unspecified: Secondary | ICD-10-CM | POA: Diagnosis not present

## 2017-03-08 ENCOUNTER — Ambulatory Visit (HOSPITAL_COMMUNITY)
Admission: RE | Admit: 2017-03-08 | Discharge: 2017-03-08 | Disposition: A | Discharge status: Home / Self Care | Payer: 59 | Source: Ambulatory Visit | Attending: Obstetrics and Gynecology | Admitting: Obstetrics and Gynecology

## 2017-03-08 ENCOUNTER — Encounter (HOSPITAL_COMMUNITY): Payer: Self-pay

## 2017-03-08 DIAGNOSIS — O98513 Other viral diseases complicating pregnancy, third trimester: Secondary | ICD-10-CM | POA: Diagnosis not present

## 2017-03-08 DIAGNOSIS — Z3A37 37 weeks gestation of pregnancy: Secondary | ICD-10-CM | POA: Diagnosis not present

## 2017-03-08 DIAGNOSIS — B343 Parvovirus infection, unspecified: Secondary | ICD-10-CM

## 2017-03-15 ENCOUNTER — Other Ambulatory Visit (HOSPITAL_COMMUNITY): Payer: Self-pay | Admitting: Maternal and Fetal Medicine

## 2017-03-15 ENCOUNTER — Ambulatory Visit (HOSPITAL_COMMUNITY)
Admission: RE | Admit: 2017-03-15 | Discharge: 2017-03-15 | Disposition: A | Discharge status: Home / Self Care | Payer: 59 | Source: Ambulatory Visit | Attending: Obstetrics and Gynecology | Admitting: Obstetrics and Gynecology

## 2017-03-15 ENCOUNTER — Encounter (HOSPITAL_COMMUNITY): Payer: Self-pay

## 2017-03-15 DIAGNOSIS — Z3A38 38 weeks gestation of pregnancy: Secondary | ICD-10-CM | POA: Insufficient documentation

## 2017-03-15 DIAGNOSIS — O98513 Other viral diseases complicating pregnancy, third trimester: Principal | ICD-10-CM

## 2017-03-15 DIAGNOSIS — B343 Parvovirus infection, unspecified: Secondary | ICD-10-CM

## 2017-03-18 ENCOUNTER — Inpatient Hospital Stay (HOSPITAL_COMMUNITY)
Admission: AD | Admit: 2017-03-18 | Discharge: 2017-03-19 | Disposition: A | Discharge status: Home / Self Care | Payer: 59 | Source: Ambulatory Visit | Attending: Obstetrics and Gynecology | Admitting: Obstetrics and Gynecology

## 2017-03-18 DIAGNOSIS — O471 False labor at or after 37 completed weeks of gestation: Secondary | ICD-10-CM

## 2017-03-18 NOTE — MAU Note (Signed)
Ctxs since 1800 for apart. Now 5-48mins but stronger. GBS positive and monitored for last 12wks due to parvo. Denies LOF or bleeding.

## 2017-03-19 ENCOUNTER — Encounter (HOSPITAL_COMMUNITY): Payer: Self-pay | Admitting: *Deleted

## 2017-03-19 NOTE — MAU Provider Note (Signed)
Pt seen with irregular contractions.  FHR category 1 Cervix 1-2/50/-3 and pt stable for d/c home

## 2017-03-19 NOTE — MAU Note (Signed)
I have communicated with Dr. Senaida Ores and reviewed vital signs:  Vitals:   03/18/17 2348  Resp: 18  Temp: 97.8 F (36.6 C)    Vaginal exam:  Dilation: 1.5 Effacement (%): Thick Cervical Position: Middle Station: -3 Presentation: Vertex Exam by:: B Heath Tesler RN,   Also reviewed contraction pattern and that non-stress test is reactive.  It has been documented that patient is contracting irregularly with no cervical change since her last office visit on Wednesday not indicating active labor.  Patient denies any other complaints.  Based on this report provider has given order for discharge.  A discharge order and diagnosis entered by a provider.   Labor discharge instructions reviewed with patient.

## 2017-03-19 NOTE — Discharge Instructions (Signed)
Fetal Movement Counts °Patient Name: ________________________________________________ Patient Due Date: ____________________ °What is a fetal movement count? °A fetal movement count is the number of times that you feel your baby move during a certain amount of time. This may also be called a fetal kick count. A fetal movement count is recommended for every pregnant woman. You may be asked to start counting fetal movements as early as week 28 of your pregnancy. °Pay attention to when your baby is most active. You may notice your baby's sleep and wake cycles. You may also notice things that make your baby move more. You should do a fetal movement count: °· When your baby is normally most active. °· At the same time each day. ° °A good time to count movements is while you are resting, after having something to eat and drink. °How do I count fetal movements? °1. Find a quiet, comfortable area. Sit, or lie down on your side. °2. Write down the date, the start time and stop time, and the number of movements that you felt between those two times. Take this information with you to your health care visits. °3. For 2 hours, count kicks, flutters, swishes, rolls, and jabs. You should feel at least 10 movements during 2 hours. °4. You may stop counting after you have felt 10 movements. °5. If you do not feel 10 movements in 2 hours, have something to eat and drink. Then, keep resting and counting for 1 hour. If you feel at least 4 movements during that hour, you may stop counting. °Contact a health care provider if: °· You feel fewer than 4 movements in 2 hours. °· Your baby is not moving like he or she usually does. °Date: ____________ Start time: ____________ Stop time: ____________ Movements: ____________ °Date: ____________ Start time: ____________ Stop time: ____________ Movements: ____________ °Date: ____________ Start time: ____________ Stop time: ____________ Movements: ____________ °Date: ____________ Start time:  ____________ Stop time: ____________ Movements: ____________ °Date: ____________ Start time: ____________ Stop time: ____________ Movements: ____________ °Date: ____________ Start time: ____________ Stop time: ____________ Movements: ____________ °Date: ____________ Start time: ____________ Stop time: ____________ Movements: ____________ °Date: ____________ Start time: ____________ Stop time: ____________ Movements: ____________ °Date: ____________ Start time: ____________ Stop time: ____________ Movements: ____________ °This information is not intended to replace advice given to you by your health care provider. Make sure you discuss any questions you have with your health care provider. °Document Released: 06/29/2006 Document Revised: 01/27/2016 Document Reviewed: 07/09/2015 °Elsevier Interactive Patient Education © 2018 [employer] °Braxton Hicks Contractions °Contractions of the uterus can occur throughout pregnancy, but they are not always a sign that you are in labor. You may have practice contractions called Braxton Hicks contractions. These false labor contractions are sometimes confused with true labor. °What are Braxton Hicks contractions? °Braxton Hicks contractions are tightening movements that occur in the muscles of the uterus before labor. Unlike true labor contractions, these contractions do not result in opening (dilation) and thinning of the cervix. Toward the end of pregnancy (32-34 weeks), Braxton Hicks contractions can happen more often and may become stronger. These contractions are sometimes difficult to tell apart from true labor because they can be very uncomfortable. You should not feel embarrassed if you go to the hospital with false labor. °Sometimes, the only way to tell if you are in true labor is for your health care provider to look for changes in the cervix. The health care provider will do a physical exam and may monitor your contractions. If   you are not in true labor, the exam  should show that your cervix is not dilating and your water has not broken. °If there are no prenatal problems or other health problems associated with your pregnancy, it is completely safe for you to be sent home with false labor. You may continue to have Braxton Hicks contractions until you go into true labor. °How can I tell the difference between true labor and false labor? °· Differences °? False labor °? Contractions last 30-70 seconds.: Contractions are usually shorter and not as strong as true labor contractions. °? Contractions become very regular.: Contractions are usually irregular. °? Discomfort is usually felt in the top of the uterus, and it spreads to the lower abdomen and low back.: Contractions are often felt in the front of the lower abdomen and in the groin. °? Contractions do not go away with walking.: Contractions may go away when you walk around or change positions while lying down. °? Contractions usually become more intense and increase in frequency.: Contractions get weaker and are shorter-lasting as time goes on. °? The cervix dilates and gets thinner.: The cervix usually does not dilate or become thin. °Follow these instructions at home: °· Take over-the-counter and prescription medicines only as told by your health care provider. °· Keep up with your usual exercises and follow other instructions from your health care provider. °· Eat and drink lightly if you think you are going into labor. °· If Braxton Hicks contractions are making you uncomfortable: °? Change your position from lying down or resting to walking, or change from walking to resting. °? Sit and rest in a tub of warm water. °? Drink enough fluid to keep your urine clear or pale yellow. Dehydration may cause these contractions. °? Do slow and deep breathing several times an hour. °· Keep all follow-up prenatal visits as told by your health care provider. This is important. °Contact a health care provider if: °· You have a  fever. °· You have continuous pain in your abdomen. °Get help right away if: °· Your contractions become stronger, more regular, and closer together. °· You have fluid leaking or gushing from your vagina. °· You pass blood-tinged mucus (bloody show). °· You have bleeding from your vagina. °· You have low back pain that you never had before. °· You feel your baby’s head pushing down and causing pelvic pressure. °· Your baby is not moving inside you as much as it used to. °Summary °· Contractions that occur before labor are called Braxton Hicks contractions, false labor, or practice contractions. °· Braxton Hicks contractions are usually shorter, weaker, farther apart, and less regular than true labor contractions. True labor contractions usually become progressively stronger and regular and they become more frequent. °· Manage discomfort from Braxton Hicks contractions by changing position, resting in a warm bath, drinking plenty of water, or practicing deep breathing. °This information is not intended to replace advice given to you by your health care provider. Make sure you discuss any questions you have with your health care provider. °Document Released: 05/30/2005 Document Revised: 04/18/2016 Document Reviewed: 04/18/2016 °Elsevier Interactive Patient Education © 2017 [employer] ° °

## 2017-03-19 NOTE — MAU Note (Signed)
Pt reports uc's strong at home q 2 minutes. Pt feeling that the contractions are less frequent since arriving to MAU. +FM

## 2017-03-22 ENCOUNTER — Encounter (HOSPITAL_COMMUNITY): Payer: Self-pay | Admitting: *Deleted

## 2017-03-22 ENCOUNTER — Inpatient Hospital Stay (HOSPITAL_COMMUNITY): Payer: 59 | Admitting: Anesthesiology

## 2017-03-22 ENCOUNTER — Inpatient Hospital Stay (HOSPITAL_COMMUNITY)
Admission: AD | Admit: 2017-03-22 | Discharge: 2017-03-23 | DRG: 807 | Disposition: A | Discharge status: Home / Self Care | Payer: 59 | Source: Ambulatory Visit | Attending: Obstetrics and Gynecology | Admitting: Obstetrics and Gynecology

## 2017-03-22 DIAGNOSIS — O99344 Other mental disorders complicating childbirth: Secondary | ICD-10-CM | POA: Diagnosis present

## 2017-03-22 DIAGNOSIS — F909 Attention-deficit hyperactivity disorder, unspecified type: Secondary | ICD-10-CM | POA: Diagnosis present

## 2017-03-22 DIAGNOSIS — O99824 Streptococcus B carrier state complicating childbirth: Principal | ICD-10-CM | POA: Diagnosis present

## 2017-03-22 DIAGNOSIS — F419 Anxiety disorder, unspecified: Secondary | ICD-10-CM | POA: Diagnosis present

## 2017-03-22 DIAGNOSIS — Z880 Allergy status to penicillin: Secondary | ICD-10-CM | POA: Diagnosis not present

## 2017-03-22 DIAGNOSIS — Z3A39 39 weeks gestation of pregnancy: Secondary | ICD-10-CM

## 2017-03-22 DIAGNOSIS — O98913 Unspecified maternal infectious and parasitic disease complicating pregnancy, third trimester: Secondary | ICD-10-CM | POA: Diagnosis present

## 2017-03-22 DIAGNOSIS — O26893 Other specified pregnancy related conditions, third trimester: Secondary | ICD-10-CM | POA: Diagnosis present

## 2017-03-22 LAB — TYPE AND SCREEN
ABO/RH(D): O POS
ANTIBODY SCREEN: NEGATIVE

## 2017-03-22 LAB — CBC
HCT: 35.5 % — ABNORMAL LOW (ref 36.0–46.0)
Hemoglobin: 12.3 g/dL (ref 12.0–15.0)
MCH: 30 pg (ref 26.0–34.0)
MCHC: 34.6 g/dL (ref 30.0–36.0)
MCV: 86.6 fL (ref 78.0–100.0)
PLATELETS: 147 10*3/uL — AB (ref 150–400)
RBC: 4.1 MIL/uL (ref 3.87–5.11)
RDW: 12.8 % (ref 11.5–15.5)
WBC: 6.4 10*3/uL (ref 4.0–10.5)

## 2017-03-22 LAB — RPR: RPR Ser Ql: NONREACTIVE

## 2017-03-22 MED ORDER — PRENATAL MULTIVITAMIN CH
1.0000 | ORAL_TABLET | Freq: Every day | ORAL | Status: DC
  Administered 2017-03-23: 1 via ORAL
  Filled 2017-03-22: qty 1

## 2017-03-22 MED ORDER — PHENYLEPHRINE 40 MCG/ML (10ML) SYRINGE FOR IV PUSH (FOR BLOOD PRESSURE SUPPORT): 80.0000 ug | PREFILLED_SYRINGE | INTRAVENOUS | Status: DC | PRN

## 2017-03-22 MED ORDER — CEFAZOLIN SODIUM-DEXTROSE 2-4 GM/100ML-% IV SOLN
2.0000 g | Freq: Once | INTRAVENOUS | Status: AC
  Administered 2017-03-22: 2 g via INTRAVENOUS

## 2017-03-22 MED ORDER — DIPHENHYDRAMINE HCL 50 MG/ML IJ SOLN: 12.5000 mg | INTRAMUSCULAR | Status: DC | PRN

## 2017-03-22 MED ORDER — FAMOTIDINE 20 MG PO TABS
20.0000 mg | ORAL_TABLET | Freq: Every day | ORAL | Status: DC
  Administered 2017-03-22: 20 mg via ORAL
  Filled 2017-03-22: qty 1

## 2017-03-22 MED ORDER — SERTRALINE HCL 25 MG PO TABS
25.0000 mg | ORAL_TABLET | Freq: Every day | ORAL | Status: DC
  Administered 2017-03-23: 25 mg via ORAL
  Filled 2017-03-22 (×2): qty 1

## 2017-03-22 MED ORDER — EPHEDRINE 5 MG/ML INJ: 10.0000 mg | INTRAVENOUS | Status: DC | PRN

## 2017-03-22 MED ORDER — LACTATED RINGERS IV SOLN: 500.0000 mL | Freq: Once | INTRAVENOUS | Status: DC

## 2017-03-22 MED ORDER — LACTATED RINGERS IV SOLN: INTRAVENOUS | Status: DC

## 2017-03-22 MED ORDER — KETOROLAC TROMETHAMINE 30 MG/ML IJ SOLN: 30.0000 mg | Freq: Four times a day (QID) | INTRAMUSCULAR | Status: DC | PRN

## 2017-03-22 MED ORDER — SIMETHICONE 80 MG PO CHEW: 80.0000 mg | CHEWABLE_TABLET | ORAL | Status: DC | PRN

## 2017-03-22 MED ORDER — TRIAMCINOLONE ACETONIDE 0.1 % EX CREA
TOPICAL_CREAM | Freq: Four times a day (QID) | CUTANEOUS | Status: DC | PRN
  Filled 2017-03-22: qty 15

## 2017-03-22 MED ORDER — DIPHENHYDRAMINE HCL 25 MG PO CAPS: 25.0000 mg | ORAL_CAPSULE | ORAL | Status: DC | PRN

## 2017-03-22 MED ORDER — ONDANSETRON HCL 4 MG/2ML IJ SOLN
4.0000 mg | INTRAMUSCULAR | Status: DC | PRN
Start: 2017-03-22 — End: 2017-03-23
  Administered 2017-03-22: 4 mg via INTRAVENOUS
  Filled 2017-03-22: qty 2

## 2017-03-22 MED ORDER — ACETAMINOPHEN 325 MG PO TABS
650.0000 mg | ORAL_TABLET | ORAL | Status: DC | PRN
  Administered 2017-03-23 (×2): 650 mg via ORAL
  Filled 2017-03-22 (×3): qty 2

## 2017-03-22 MED ORDER — LACTATED RINGERS IV SOLN
INTRAVENOUS | Status: DC
  Administered 2017-03-22: 16:00:00 via INTRAVENOUS

## 2017-03-22 MED ORDER — SODIUM CHLORIDE 0.9% FLUSH: 3.0000 mL | INTRAVENOUS | Status: DC | PRN

## 2017-03-22 MED ORDER — BENZOCAINE-MENTHOL 20-0.5 % EX AERO
1.0000 "application " | INHALATION_SPRAY | CUTANEOUS | Status: DC | PRN
  Administered 2017-03-22: 1 via TOPICAL
  Filled 2017-03-22: qty 56

## 2017-03-22 MED ORDER — OXYTOCIN BOLUS FROM INFUSION
500.0000 mL | Freq: Once | INTRAVENOUS | Status: AC
  Administered 2017-03-22: 500 mL via INTRAVENOUS

## 2017-03-22 MED ORDER — SOD CITRATE-CITRIC ACID 500-334 MG/5ML PO SOLN: 30.0000 mL | ORAL | Status: DC | PRN

## 2017-03-22 MED ORDER — CEFAZOLIN SODIUM-DEXTROSE 1-4 GM/50ML-% IV SOLN
1.0000 g | Freq: Three times a day (TID) | INTRAVENOUS | Status: DC
  Filled 2017-03-22 (×2): qty 50

## 2017-03-22 MED ORDER — LACTATED RINGERS IV SOLN: 500.0000 mL | INTRAVENOUS | Status: DC | PRN

## 2017-03-22 MED ORDER — ONDANSETRON HCL 4 MG PO TABS: 4.0000 mg | ORAL_TABLET | ORAL | Status: DC | PRN

## 2017-03-22 MED ORDER — OXYTOCIN 40 UNITS IN LACTATED RINGERS INFUSION - SIMPLE MED
1.0000 m[IU]/min | INTRAVENOUS | Status: DC
  Administered 2017-03-22: 2 m[IU]/min via INTRAVENOUS
  Administered 2017-03-22: 8 m[IU]/min via INTRAVENOUS
  Filled 2017-03-22: qty 1000

## 2017-03-22 MED ORDER — PHENYLEPHRINE 40 MCG/ML (10ML) SYRINGE FOR IV PUSH (FOR BLOOD PRESSURE SUPPORT)
80.0000 ug | PREFILLED_SYRINGE | INTRAVENOUS | Status: DC | PRN
  Filled 2017-03-22: qty 5

## 2017-03-22 MED ORDER — COCONUT OIL OIL
1.0000 "application " | TOPICAL_OIL | Status: DC | PRN
  Administered 2017-03-22: 1 via TOPICAL
  Filled 2017-03-22: qty 120

## 2017-03-22 MED ORDER — NALOXONE HCL 0.4 MG/ML IJ SOLN
0.4000 mg | INTRAMUSCULAR | Status: DC | PRN
Start: 2017-03-22 — End: 2017-03-23

## 2017-03-22 MED ORDER — OXYCODONE-ACETAMINOPHEN 5-325 MG PO TABS: 1.0000 | ORAL_TABLET | ORAL | Status: DC | PRN

## 2017-03-22 MED ORDER — PHENYLEPHRINE 40 MCG/ML (10ML) SYRINGE FOR IV PUSH (FOR BLOOD PRESSURE SUPPORT)
80.0000 ug | PREFILLED_SYRINGE | INTRAVENOUS | Status: DC | PRN
  Filled 2017-03-22: qty 5
  Filled 2017-03-22: qty 10

## 2017-03-22 MED ORDER — ZOLPIDEM TARTRATE 5 MG PO TABS
5.0000 mg | ORAL_TABLET | Freq: Every evening | ORAL | Status: DC | PRN
  Administered 2017-03-22: 5 mg via ORAL
  Filled 2017-03-22: qty 1

## 2017-03-22 MED ORDER — NALOXONE HCL 2 MG/2ML IJ SOSY: 1.0000 ug/kg/h | PREFILLED_SYRINGE | INTRAMUSCULAR | Status: DC | PRN

## 2017-03-22 MED ORDER — OXYCODONE HCL 5 MG PO TABS: 10.0000 mg | ORAL_TABLET | ORAL | Status: DC | PRN

## 2017-03-22 MED ORDER — ACETAMINOPHEN 325 MG PO TABS
650.0000 mg | ORAL_TABLET | ORAL | Status: DC | PRN
  Administered 2017-03-22: 650 mg via ORAL
  Filled 2017-03-22: qty 2

## 2017-03-22 MED ORDER — IBUPROFEN 600 MG PO TABS
600.0000 mg | ORAL_TABLET | Freq: Four times a day (QID) | ORAL | Status: DC
  Administered 2017-03-22 – 2017-03-23 (×4): 600 mg via ORAL
  Filled 2017-03-22 (×4): qty 1

## 2017-03-22 MED ORDER — BUTORPHANOL TARTRATE 1 MG/ML IJ SOLN: 1.0000 mg | INTRAMUSCULAR | Status: DC | PRN

## 2017-03-22 MED ORDER — WITCH HAZEL-GLYCERIN EX PADS: 1.0000 "application " | MEDICATED_PAD | CUTANEOUS | Status: DC | PRN

## 2017-03-22 MED ORDER — OXYCODONE HCL 5 MG PO TABS
5.0000 mg | ORAL_TABLET | ORAL | Status: DC | PRN
  Administered 2017-03-23 (×2): 5 mg via ORAL
  Filled 2017-03-22 (×2): qty 1

## 2017-03-22 MED ORDER — EPHEDRINE 5 MG/ML INJ
10.0000 mg | INTRAVENOUS | Status: DC | PRN
  Filled 2017-03-22: qty 2

## 2017-03-22 MED ORDER — DIBUCAINE 1 % RE OINT: 1.0000 "application " | TOPICAL_OINTMENT | RECTAL | Status: DC | PRN

## 2017-03-22 MED ORDER — ONDANSETRON HCL 4 MG/2ML IJ SOLN
4.0000 mg | Freq: Four times a day (QID) | INTRAMUSCULAR | Status: DC | PRN
  Administered 2017-03-22: 4 mg via INTRAVENOUS
  Filled 2017-03-22: qty 2

## 2017-03-22 MED ORDER — DIPHENHYDRAMINE HCL 25 MG PO CAPS: 25.0000 mg | ORAL_CAPSULE | Freq: Four times a day (QID) | ORAL | Status: DC | PRN

## 2017-03-22 MED ORDER — NALBUPHINE HCL 10 MG/ML IJ SOLN: 5.0000 mg | Freq: Once | INTRAMUSCULAR | Status: DC | PRN

## 2017-03-22 MED ORDER — SENNOSIDES-DOCUSATE SODIUM 8.6-50 MG PO TABS
2.0000 | ORAL_TABLET | ORAL | Status: DC
  Administered 2017-03-22: 2 via ORAL
  Filled 2017-03-22: qty 2

## 2017-03-22 MED ORDER — LIDOCAINE HCL (PF) 1 % IJ SOLN
30.0000 mL | INTRAMUSCULAR | Status: DC | PRN
  Filled 2017-03-22: qty 30

## 2017-03-22 MED ORDER — TERBUTALINE SULFATE 1 MG/ML IJ SOLN
0.2500 mg | Freq: Once | INTRAMUSCULAR | Status: DC | PRN
  Filled 2017-03-22: qty 1

## 2017-03-22 MED ORDER — SCOPOLAMINE 1 MG/3DAYS TD PT72: 1.0000 | MEDICATED_PATCH | Freq: Once | TRANSDERMAL | Status: DC

## 2017-03-22 MED ORDER — NALBUPHINE HCL 10 MG/ML IJ SOLN: 5.0000 mg | INTRAMUSCULAR | Status: DC | PRN

## 2017-03-22 MED ORDER — LIDOCAINE HCL (PF) 1 % IJ SOLN
INTRAMUSCULAR | Status: DC | PRN
  Administered 2017-03-22 (×2): 5 mL via EPIDURAL

## 2017-03-22 MED ORDER — OXYCODONE-ACETAMINOPHEN 5-325 MG PO TABS: 2.0000 | ORAL_TABLET | ORAL | Status: DC | PRN

## 2017-03-22 MED ORDER — ONDANSETRON HCL 4 MG/2ML IJ SOLN: 4.0000 mg | Freq: Three times a day (TID) | INTRAMUSCULAR | Status: DC | PRN

## 2017-03-22 MED ORDER — OXYTOCIN 40 UNITS IN LACTATED RINGERS INFUSION - SIMPLE MED: 2.5000 [IU]/h | INTRAVENOUS | Status: DC

## 2017-03-22 MED ORDER — FENTANYL 2.5 MCG/ML BUPIVACAINE 1/10 % EPIDURAL INFUSION (WH - ANES)
14.0000 mL/h | INTRAMUSCULAR | Status: DC | PRN
  Administered 2017-03-22: 14 mL/h via EPIDURAL
  Filled 2017-03-22: qty 100

## 2017-03-22 NOTE — Anesthesia Procedure Notes (Signed)
Epidural Patient location during procedure: OB Start time: 03/22/2017 3:46 PM End time: 03/22/2017 3:53 PM  Staffing Anesthesiologist: Tamula Morrical  Preanesthetic Checklist Completed: patient identified, site marked, surgical consent, pre-op evaluation, timeout performed, IV checked, risks and benefits discussed and monitors and equipment checked  Epidural Patient position: sitting Prep: site prepped and draped and DuraPrep Patient monitoring: continuous pulse ox and blood pressure Approach: midline Location: L4-L5 Injection technique: LOR air  Needle:  Needle type: Tuohy  Needle gauge: 17 G Needle length: 9 cm and 9 Needle insertion depth: 6 cm Catheter type: closed end flexible Catheter size: 19 Gauge Catheter at skin depth: 11 cm Test dose: negative  Assessment Events: blood not aspirated, injection not painful, no injection resistance, negative IV test and no paresthesia

## 2017-03-22 NOTE — Anesthesia Pain Management Evaluation Note (Signed)
  CRNA Pain Management Visit Note  Patient: Hannah Hill, 31 y.o., female  "Hello I am a member of the anesthesia team at [facility]. We have an anesthesia team available at all times to provide care throughout the hospital, including epidural management and anesthesia for C-section. I don't know your plan for the delivery whether it a natural birth, water birth, IV sedation, nitrous supplementation, doula or epidural, but we want to meet your pain goals."   1.Was your pain managed to your expectations on prior hospitalizations?   Yes   2.What is your expectation for pain management during this hospitalization?     Epidural  3.How can we help you reach that goal? Epidural @ pain goal.   Record the patient's initial score and the patient's pain goal.   Pain: 1  Pain Goal: 7 [facility] wants you to be able to say your pain was always managed very well.  Teresea Donley 03/22/2017

## 2017-03-22 NOTE — H&P (Signed)
Hannah Hill is a 31 y.o. female [phone] at 39+ for IOL, pt had parvovirus in [facility] - followed with MCA dopplers.  Pt also with ADHD and ANXIETY.  D/w pt r/b/a of IOL and process. Pregnancy dated by LMP.    OB History    Gravida Para Term Preterm AB Living   0 3 2   SAB TAB Ectopic Multiple Live Births   3 0 0 0 2    SAB x 3 10/10 38wk VAVD 7#9 female 2/14 37wk SVD 7#9 female  No abn pap, no STD  Past Medical History:  Diagnosis Date  . Mild preeclampsia 08/07/2012  . PONV (postoperative nausea and vomiting)   . Pregnancy induced hypertension    Past Surgical History:  Procedure Laterality Date  . NOSE SURGERY    . WISDOM TOOTH EXTRACTION     Family History: HTN, CAD, AML, schizencephaly, trisomy 98 Social History:  reports that she has never smoked. She has never used smokeless tobacco. She reports that she does not use drugs. Her alcohol history is not on file. marketing, married  All amox, betadine Meds FA, folic acid, atareax, Zoloft     Maternal Diabetes: No Genetic Screening: Normal Maternal Ultrasounds/Referrals: Normal Fetal Ultrasounds or other Referrals:  None Maternal Substance Abuse:  No Significant Maternal Medications:  None  Zoloft Significant Maternal Lab Results:  Lab values include: Group B Strep positive Other Comments:  None  Review of Systems  Constitutional: Negative.   HENT: Negative.   Eyes: Negative.   Respiratory: Negative.   Cardiovascular: Negative.   Gastrointestinal: Negative.   Genitourinary: Negative.   Musculoskeletal: Negative.   Skin: Negative.   Neurological: Negative.   Psychiatric/Behavioral: Negative.    Maternal Medical History:  Contractions: Frequency: irregular.   Perceived severity is moderate.    Fetal activity: Perceived fetal activity is normal.    Prenatal Complications - Diabetes: none.      Blood pressure (!) 130/91, pulse 80, temperature 99 F (37.2 C), temperature source Oral, resp. rate 16, last  menstrual period 06/22/2016. Maternal Exam:  Abdomen: Patient reports no abdominal tenderness. Fundal height is appropriate for gestational age.   Estimated fetal weight is 7.5-8#.   Fetal presentation: vertex  Introitus: Normal vulva. Normal vagina.    Physical Exam  Constitutional: She is oriented to person, place, and time. She appears well-developed and well-nourished.  HENT:  Head: Normocephalic and atraumatic.  Cardiovascular: Normal rate and regular rhythm.   Respiratory: Effort normal and breath sounds normal. No respiratory distress. She has no wheezes.  GI: Soft. Bowel sounds are normal. She exhibits no distension. There is no tenderness.  Musculoskeletal: Normal range of motion.  Neurological: She is alert and oriented to person, place, and time.  Skin: Skin is warm and dry.  Psychiatric: She has a normal mood and affect.    Prenatal labs: ABO, Rh:  O+ Antibody:  neg Rubella: immune RPR:   NR HBsAg:   neg HIV:   neg GBS:   pos  Hgb 13.8/Plt 121/Ur Cx neg/GC neg/Chl neg/Varicella immune/nl NT - nl first triimester US/screen/glucola WNL  Tdap 02/21/17  MCA dopplers followed - elevated and then back to normal Nl anat, ant plac, female   Assessment/Plan: 31yo [id number] at 39+ for IOL, h/o parvovirus Admit to L&D Epidural, IV pain meds, and nitrous AROM after Ancef, pitocin to augment Expect SVD   Hannah Hill 03/22/2017, 8:23 AM

## 2017-03-22 NOTE — Progress Notes (Signed)
Patient ID: Hannah Hill, female   DOB: 1985-10-03, 31 y.o.   MRN: [id number]   SROM, clear fluid.  Feeling ctx  AFVSS gen NAD FHTs 135, mod var, + accels, category 1 toco Q  IUPC placed w/o diff/comp  Continue IOL  4/70/-2

## 2017-03-22 NOTE — Anesthesia Preprocedure Evaluation (Signed)
Anesthesia Evaluation  Patient identified by MRN, date of birth, ID band Patient awake    Reviewed: Allergy & Precautions, H&P , NPO status , Patient's Chart, lab work & pertinent test results, reviewed documented beta blocker date and time   History of Anesthesia Complications (+) PONV and history of anesthetic complications  Airway Mallampati: II  TM Distance: >3 FB Neck ROM: full    Dental no notable dental hx.    Pulmonary neg pulmonary ROS,    Pulmonary exam normal breath sounds clear to auscultation       Cardiovascular hypertension, negative cardio ROS Normal cardiovascular exam Rhythm:regular Rate:Normal     Neuro/Psych negative neurological ROS  negative psych ROS   GI/Hepatic negative GI ROS, Neg liver ROS,   Endo/Other  negative endocrine ROS  Renal/GU negative Renal ROS  negative genitourinary   Musculoskeletal   Abdominal   Peds  Hematology negative hematology ROS (+)   Anesthesia Other Findings   Reproductive/Obstetrics (+) Pregnancy                             Anesthesia Physical Anesthesia Plan  ASA: II  Anesthesia Plan: Epidural   Post-op Pain Management:    Induction:   PONV Risk Score and Plan:   Airway Management Planned:   Additional Equipment:   Intra-op Plan:   Post-operative Plan:   Informed Consent: I have reviewed the patients History and Physical, chart, labs and discussed the procedure including the risks, benefits and alternatives for the proposed anesthesia with the patient or authorized representative who has indicated his/her understanding and acceptance.     Plan Discussed with:   Anesthesia Plan Comments:         Anesthesia Quick Evaluation

## 2017-03-23 LAB — CBC
HCT: 29.2 % — ABNORMAL LOW (ref 36.0–46.0)
Hemoglobin: 10 g/dL — ABNORMAL LOW (ref 12.0–15.0)
MCH: 29.9 pg (ref 26.0–34.0)
MCHC: 34.2 g/dL (ref 30.0–36.0)
MCV: 87.2 fL (ref 78.0–100.0)
PLATELETS: 121 10*3/uL — AB (ref 150–400)
RBC: 3.35 MIL/uL — ABNORMAL LOW (ref 3.87–5.11)
RDW: 12.8 % (ref 11.5–15.5)
WBC: 8.7 10*3/uL (ref 4.0–10.5)

## 2017-03-23 MED ORDER — OXYCODONE HCL 5 MG PO TABS: 5.0000 mg | ORAL_TABLET | ORAL | 0 refills | Status: DC | PRN

## 2017-03-23 MED ORDER — SOD CITRATE-CITRIC ACID 500-334 MG/5ML PO SOLN: 30.0000 mL | Freq: Once | ORAL | Status: DC

## 2017-03-23 MED ORDER — MEPERIDINE HCL 25 MG/ML IJ SOLN: 6.2500 mg | INTRAMUSCULAR | Status: DC | PRN

## 2017-03-23 MED ORDER — LACTATED RINGERS IV SOLN: 100.0000 mL/h | INTRAVENOUS | Status: DC

## 2017-03-23 MED ORDER — IBUPROFEN 600 MG PO TABS: 600.0000 mg | ORAL_TABLET | Freq: Four times a day (QID) | ORAL | 0 refills | Status: DC

## 2017-03-23 MED ORDER — ACETAMINOPHEN 325 MG PO TABS: 650.0000 mg | ORAL_TABLET | ORAL | 1 refills | Status: AC | PRN

## 2017-03-23 NOTE — Progress Notes (Signed)
   03/23/17 0810  Edinburgh Postnatal Depression Scale:  In the Past 7 Days  I have been able to laugh and see the funny side of things. 1  I have looked forward with enjoyment to things. 0  I have blamed myself unnecessarily when things went wrong. 2  I have been anxious or worried for no good reason. 3  I have felt scared or panicky for no good reason. 3  Things have been getting on top of me. 1  I have been so unhappy that I have had difficulty sleeping. 1  I have felt sad or miserable. 0  I have been so unhappy that I have been crying. 0  The thought of harming myself has occurred to me. 0  Edinburgh Postnatal Depression Scale Total 11  postpartum score. Pt states she deals with anxiety by exercise. PT & FOB state she had this same anxiety after another child and has anxiety in general.

## 2017-03-23 NOTE — Progress Notes (Signed)
Post Partum Day 1 Subjective: no complaints, up ad lib and tolerating PO  States anxiety already improved with baby safely delivered.  Requesting early d/c home  Objective: Blood pressure 126/81, pulse (!) 52, temperature 97.8 F (36.6 C), temperature source Oral, resp. rate 20, height  (1.626 m), weight 80.7 kg (178 lb), last menstrual period 06/22/2016, SpO2 100 %, unknown if currently breastfeeding.  Physical Exam:  General: alert and cooperative Lochia: appropriate Uterine Fundus: firm    Recent Labs  03/22/17 0820 03/23/17 0547  HGB 12.3 10.0*  HCT 35.5* 29.2*    Assessment/Plan: Discharge home if baby able to go this PM    LOS: 1 day   Hannah Hill 03/23/2017, 9:39 AM

## 2017-03-23 NOTE — Addendum Note (Signed)
Addendum  created 03/23/17 1001 by Yolonda Kida, CRNA   Charge Capture section accepted, Sign clinical note, Visit diagnoses modified

## 2017-03-23 NOTE — Lactation Note (Addendum)
This note was copied from a baby's chart. Lactation Consultation Note Baby 12 hours old, mom states baby slept 4 hours straight, then has had small cluster feeding past few hours.  Baby had large emesis. Noted gagging afterwards. Mom trying to put baby to breast. Discussed cueing verses gagging.   Mom BF her 1st child for 6 weeks.child now 33 yrs old. Mom stated she didn't know what she was doing, had over production, mastitis, sore nipples, and stopped at 6 weeks.  2nd child now 63 yrs old, mom BF for 8 months. Mom pumped after BF and applied ice to cut back on milk supply. Didn't have any infections. BF went well.  Mom has large pendulous breast w/everted nipples. Mom stated trying to get baby to open wider. Suggested touching top lip w/nipple and football position. Mom demonstrated hand expression of colostrum. Newborn feeding habits, STS, I&O, cluster feeding, supply and demand discussed. Mom encouraged to feed baby 8-12 times/24 hours and with feeding cues.  [facility] brochure given w/resources, support groups and LC services.  Patient Name: Hannah Hill [id number] Date: 03/23/2017 Reason for consult: Initial assessment   Maternal Data Has patient been taught Hand Expression?: Yes Does the patient have breastfeeding experience prior to this delivery?: Yes  Feeding Feeding Type: Breast Fed Length of feed: 10 min  LATCH Score Latch: Grasps breast easily, tongue down, lips flanged, rhythmical sucking.  Audible Swallowing: A few with stimulation  Type of Nipple: Everted at rest and after stimulation  Comfort (Breast/Nipple): Soft / non-tender  Hold (Positioning): No assistance needed to correctly position infant at breast.  LATCH Score: 9  Interventions Interventions: Breast feeding basics reviewed;Breast compression;Skin to skin;Position options;Breast massage;Hand express;Coconut oil  Lactation Tools Discussed/Used     Consult Status Consult Status: Follow-up Date:  03/24/17 Follow-up type: In-patient    Charyl Dancer 03/23/2017, 5:48 AM

## 2017-03-23 NOTE — Anesthesia Postprocedure Evaluation (Signed)
Anesthesia Post Note  Patient: Hannah Hill  Procedure(s) Performed: AN AD HOC LABOR EPIDURAL     Patient location during evaluation: Mother Baby Anesthesia Type: Epidural Level of consciousness: awake, awake and alert, oriented and patient cooperative Pain management: pain level controlled Vital Signs Assessment: post-procedure vital signs reviewed and stable Respiratory status: spontaneous breathing, nonlabored ventilation and respiratory function stable Cardiovascular status: stable Postop Assessment: no headache, no backache, patient able to bend at knees and no apparent nausea or vomiting Anesthetic complications: no    Last Vitals:  Vitals:   03/22/17 2323 03/23/17 0506  BP: 123/66 126/81  Pulse: (!) 59 (!) 52  Resp: 20 20  Temp: 36.8 C 36.6 C  SpO2:      Last Pain:  Vitals:   03/23/17 0540  TempSrc:   PainSc: 2    Pain Goal:                 Leaman Abe L

## 2017-03-23 NOTE — Progress Notes (Signed)
   03/23/17 0800  Edinburgh Postnatal Depression Scale:  In the Past 7 Days  I have been able to laugh and see the funny side of things. 1  I have looked forward with enjoyment to things. 0  I have blamed myself unnecessarily when things went wrong. 2  I have been anxious or worried for no good reason. 3  I have felt scared or panicky for no good reason. 3  Things have been getting on top of me. 2  I have been so unhappy that I have had difficulty sleeping. 3  I have felt sad or miserable. 2  I have been so unhappy that I have been crying. 1  The thought of harming myself has occurred to me. 0  Edinburgh Postnatal Depression Scale Total 17  pt stated this is during pregnancy. "I was convinced she was going to be stillborn after my other losses".

## 2017-03-23 NOTE — Lactation Note (Signed)
This note was copied from a baby's chart. Lactation Consultation Note  Patient Name: Girl Ceana Fiala [id number] Date: 03/23/2017   Visited with Mom, baby 25 hrs old.  Mom states baby is latching well to the breast. Latch score of 9, and 3% weight loss. Mom complaining of significant uterine cramping, and is in bed, on her right side in a fetal position.  Nurse talking to MD about getting a stronger pain medication.       Judee Clara 03/23/2017, 12:31 PM

## 2017-03-23 NOTE — Discharge Summary (Signed)
OB Discharge Summary     Patient Name: Hannah Hill DOB: 04-15-1986 MRN: [id number]  Date of admission: 03/22/2017 Delivering MD: Sherian Rein   Date of discharge: 03/23/2017  Admitting diagnosis: INDUCTION Intrauterine pregnancy: [redacted]w[redacted]d     Secondary diagnosis:  Principal Problem:   SVD (spontaneous vaginal delivery) Active Problems:   Indication for care in labor or delivery   Infectious disease affecting pregnancy in third trimester, antepartum  Additional problems: none     Discharge diagnosis: Term Pregnancy Delivered                                                                                                Post partum procedures:none  Augmentation: AROM and Pitocin  Complications: None  Hospital course:  Induction of Labor With Vaginal Delivery   31 y.o. yo [phone] at [redacted]w[redacted]d was admitted to the hospital 03/22/2017 for induction of labor.  Indication for induction: Favorable cervix at term.  Patient had an uncomplicated labor course as follows: Membrane Rupture Time/Date: 1:38 PM ,03/22/2017   Intrapartum Procedures: Episiotomy: None [1]                                         Lacerations:  2nd degree [3]  Patient had delivery of a Viable infant.  Information for the patient's newborn:  Adylee, Leonardo [[id number]]  Delivery Method: Vaginal, Spontaneous Delivery (Filed from Delivery Summary)   03/22/2017  Details of delivery can be found in separate delivery note.  Patient had a routine postpartum course. Patient is discharged home 03/23/17.  Physical exam  Vitals:   03/22/17 1937 03/22/17 2037 03/22/17 2323 03/23/17 0506  BP: (!) 144/84 132/72 123/66 126/81  Pulse: 71 81 (!) 59 (!) 52  Resp:   20 20  Temp: 98.3 F (36.8 C)  98.2 F (36.8 C) 97.8 F (36.6 C)  TempSrc: Oral  Oral Oral  SpO2:      Weight:      Height:       General: alert and cooperative Lochia: appropriate Uterine Fundus: firm  Labs: Lab Results  Component Value  Date   WBC 8.7 03/23/2017   HGB 10.0 (L) 03/23/2017   HCT 29.2 (L) 03/23/2017   MCV 87.2 03/23/2017   PLT 121 (L) 03/23/2017   CMP Latest Ref Rng & Units 01/08/2014  Glucose 70 - 99 mg/dL 77  BUN 6 - 23 mg/dL 7  Creatinine 5.78 - 4.69 mg/dL 6.29  Sodium 528 - 413 mEq/L 137  Potassium 3.7 - 5.3 mEq/L 3.4(L)  Chloride 96 - 112 mEq/L 105  CO2 19 - 32 mEq/L -  Calcium 8.4 - 10.5 mg/dL -  Total Protein 6.0 - 8.3 g/dL -  Total Bilirubin 0.3 - 1.2 mg/dL -  Alkaline Phos 39 - 244 U/L -  AST 0 - 37 U/L -  ALT 0 - 35 U/L -    Discharge instruction: per After Visit Summary and "Baby and Me Booklet".  After visit meds:  Allergies as  of 03/23/2017      Reactions   Amoxicillin Hives   .Has patient had a PCN reaction causing immediate rash, facial/tongue/throat swelling, SOB or lightheadedness with hypotension: Unknown Has patient had a PCN reaction causing severe rash involving mucus membranes or skin necrosis: No Has patient had a PCN reaction that required hospitalization: No Has patient had a PCN reaction occurring within the last 10 years: Yes If all of the above answers are "NO", then may proceed with Cephalosporin use.   Betadine [povidone Iodine] Rash      Medication List    TAKE these medications   acetaminophen 325 MG tablet Commonly known as:  TYLENOL Take 2 tablets (650 mg total) by mouth every 4 (four) hours as needed (for pain scale < 4).   cimetidine 200 MG tablet Commonly known as:  TAGAMET Take 200 mg by mouth daily as needed (heartburn).   ibuprofen 600 MG tablet Commonly known as:  ADVIL,MOTRIN Take 1 tablet (600 mg total) by mouth every 6 (six) hours.   prenatal multivitamin Tabs tablet Take 1 tablet by mouth daily.   sertraline 25 MG tablet Commonly known as:  ZOLOFT Take 25 mg by mouth daily.       Diet: routine diet  Activity: Advance as tolerated. Pelvic rest for 6 weeks.   Outpatient follow up:6 weeks Follow up Appt:No future  appointments. Follow up Visit:No Follow-up on file.  Postpartum contraception: Undecided  Newborn Data: Live born female  Birth Weight: 6 lb 15.3 oz (3155 g) APGAR: 9, 9  Newborn Delivery   Birth date/time:  03/22/2017 17:17:00 Delivery type:  Vaginal, Spontaneous Delivery      Baby Feeding: Breast Disposition:home with mother   03/23/2017 Oliver Pila, MD

## 2017-03-23 NOTE — Anesthesia Postprocedure Evaluation (Signed)
Anesthesia Post Note  Patient: Hannah Hill  Procedure(s) Performed: AN AD HOC LABOR EPIDURAL     Patient location during evaluation: Mother Baby Anesthesia Type: Epidural Level of consciousness: awake and alert Pain management: pain level controlled Vital Signs Assessment: post-procedure vital signs reviewed and stable Respiratory status: spontaneous breathing, nonlabored ventilation and respiratory function stable Cardiovascular status: stable Postop Assessment: no headache, no backache and epidural receding Anesthetic complications: no    Last Vitals:  Vitals:   03/22/17 2323 03/23/17 0506  BP: 123/66 126/81  Pulse: (!) 59 (!) 52  Resp: 20 20  Temp: 36.8 C 36.6 C  SpO2:      Last Pain:  Vitals:   03/23/17 0540  TempSrc:   PainSc: 2                  August Longest

## 2017-03-23 NOTE — Progress Notes (Signed)
CSW received consult for hx of Anxiety and Depression.  CSW met with MOB to offer support and complete assessment.    When CSW arrive, MOB was dressed and was watching TV with FOB; infant was asleep in bassinet.  MOB gave CSW permission to meet with MOB while FOB was present.  The family was polite, forthcoming, and receptive to meeting with CSW.  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 MOB also appeared to have insight and awareness about her MH and reported she opted for medication (Zoloft) about 2 weeks ago.  MOB did not present with any acute symptoms and reported being compliant with medication regiment.    CSW provided education regarding the baby blues period vs. perinatal mood disorders, discussed treatment and gave resources for mental health follow up if concerns arise.  CSW recommends self-evaluation during the postpartum time period using the New Mom Checklist from Postpartum Progress and encouraged MOB to contact a medical professional if symptoms are noted at any time.  MOB also will received follow-up with OB in 4 weeks as oppose to 6 weeks.   CSW identifies no further need for intervention and no barriers to discharge at this time.  Hannah Hill, MSW, LCSW Clinical Social Work [phone] 

## 2017-08-20 ENCOUNTER — Encounter (HOSPITAL_COMMUNITY): Payer: Self-pay | Admitting: Emergency Medicine

## 2017-08-20 ENCOUNTER — Emergency Department (HOSPITAL_COMMUNITY): Payer: 59

## 2017-08-20 DIAGNOSIS — F419 Anxiety disorder, unspecified: Secondary | ICD-10-CM | POA: Diagnosis not present

## 2017-08-20 DIAGNOSIS — M542 Cervicalgia: Secondary | ICD-10-CM | POA: Diagnosis present

## 2017-08-20 DIAGNOSIS — Z79899 Other long term (current) drug therapy: Secondary | ICD-10-CM | POA: Diagnosis not present

## 2017-08-20 DIAGNOSIS — M5412 Radiculopathy, cervical region: Secondary | ICD-10-CM | POA: Insufficient documentation

## 2017-08-20 LAB — I-STAT TROPONIN, ED: Troponin i, poc: 0 ng/mL (ref 0.00–0.08)

## 2017-08-20 LAB — CBC
HCT: 43.6 % (ref 36.0–46.0)
Hemoglobin: 14.6 g/dL (ref 12.0–15.0)
MCH: 29.4 pg (ref 26.0–34.0)
MCHC: 33.5 g/dL (ref 30.0–36.0)
MCV: 87.7 fL (ref 78.0–100.0)
Platelets: 207 10*3/uL (ref 150–400)
RBC: 4.97 MIL/uL (ref 3.87–5.11)
RDW: 12.8 % (ref 11.5–15.5)
WBC: 5.9 10*3/uL (ref 4.0–10.5)

## 2017-08-20 LAB — BASIC METABOLIC PANEL
Anion gap: 11 (ref 5–15)
BUN: 16 mg/dL (ref 6–20)
CO2: 23 mmol/L (ref 22–32)
CREATININE: 0.96 mg/dL (ref 0.44–1.00)
Calcium: 9.7 mg/dL (ref 8.9–10.3)
Chloride: 104 mmol/L (ref 101–111)
GFR calc Af Amer: >60 mL/min (ref >60)
Glucose, Bld: 84 mg/dL (ref 65–99)
Potassium: 3.9 mmol/L (ref 3.5–5.1)
SODIUM: 138 mmol/L (ref 135–145)

## 2017-08-20 LAB — I-STAT BETA HCG BLOOD, ED (MC, WL, AP ONLY)

## 2017-08-20 NOTE — ED Triage Notes (Signed)
Reports having left shoulder and neck pain for the last three weeks.  Also has numbness in left arm going into the hand that has been intermittent.  Seen by family physician when it started.  Reports no relief.  Using heat and tylenol with no relief in pain.  Now reports numbness in left side of face going into neck.  Reports if she pushes on her neck that pain shoots into her head.  Hx of anxiety.  Currently on zoloft.  Notably anxious in triage.

## 2017-08-21 ENCOUNTER — Emergency Department (HOSPITAL_COMMUNITY)
Admission: EM | Admit: 2017-08-21 | Discharge: 2017-08-21 | Disposition: A | Discharge status: Home / Self Care | Payer: 59 | Attending: Emergency Medicine | Admitting: Emergency Medicine

## 2017-08-21 DIAGNOSIS — F419 Anxiety disorder, unspecified: Secondary | ICD-10-CM

## 2017-08-21 DIAGNOSIS — M5412 Radiculopathy, cervical region: Secondary | ICD-10-CM

## 2017-08-21 HISTORY — DX: Anxiety disorder, unspecified: F41.9

## 2017-08-21 MED ORDER — PREDNISONE 20 MG PO TABS: ORAL_TABLET | ORAL | 0 refills | Status: DC

## 2017-08-21 NOTE — ED Provider Notes (Signed)
[facility]   CSN: [id number] Arrival date & time: 08/20/17  2234     History   Chief Complaint Chief Complaint  Patient presents with  . Shoulder Pain  . Neck Pain  . Anxiety    HPI Hannah Hill is a 32 y.o. female.  Presents to the ER for evaluation of left-sided pain.  Patient reports that she has been experiencing pain in her neck for approximately 3 weeks.  Pain is on the left side and into her shoulder.  She has been using heat and Tylenol without relief.  She has not been using NSAIDs because she has a history of GERD.  She became concerned tonight because the pain worsened and now is going all the way down her left arm and on the left side of her head.  She not only has pain but there is some numbness and tingling.  Earlier she developed some tingling around the mouth and her mother told her to come to the ER to make sure she was not having a heart attack.      Past Medical History:  Diagnosis Date  . Anxiety   . Mild preeclampsia 08/07/2012  . PONV (postoperative nausea and vomiting)   . Pregnancy induced hypertension   . SVD (spontaneous vaginal delivery) 03/22/2017    Patient Active Problem List   Diagnosis Date Noted  . Indication for care in labor or delivery 03/22/2017  . Infectious disease affecting pregnancy in third trimester, antepartum 03/22/2017  . SVD (spontaneous vaginal delivery) 03/22/2017  . Mild preeclampsia 08/07/2012    Past Surgical History:  Procedure Laterality Date  . NOSE SURGERY    . WISDOM TOOTH EXTRACTION      OB History    Gravida Para Term Preterm AB Living   6 3 3  0 3 3   SAB TAB Ectopic Multiple Live Births   3 0 0 0 3       Home Medications    Prior to Admission medications   Medication Sig Start Date End Date Taking? Authorizing Provider  acetaminophen (TYLENOL) 325 MG tablet Take 2 tablets (650 mg total) by mouth every 4 (four) hours as needed (for pain  scale < 4). 03/23/17   Huel Coteichardson, Kathy, MD  cimetidine (TAGAMET) 200 MG tablet Take 200 mg by mouth daily as needed (heartburn).     [provider]  ibuprofen (ADVIL,MOTRIN) 600 MG tablet Take 1 tablet (600 mg total) by mouth every 6 (six) hours. 03/23/17   Huel Coteichardson, Kathy, MD  oxyCODONE (OXY IR/ROXICODONE) 5 MG immediate release tablet Take 1 tablet (5 mg total) by mouth every 4 (four) hours as needed (pain scale 4-7). 03/23/17   Huel Coteichardson, Kathy, MD  predniSONE (DELTASONE) 20 MG tablet 3 tabs po daily x 3 days, then 2 tabs x 3 days, then 1.5 tabs x 3 days, then 1 tab x 3 days, then 0.5 tabs x 3 days 08/21/17   Gilda CreasePollina, Christopher J, MD  Prenatal Vit-Fe Fumarate-FA (PRENATAL MULTIVITAMIN) TABS Take 1 tablet by mouth daily. 08/10/12   Bovard-Stuckert, Augusto GambleJody, MD  sertraline (ZOLOFT) 25 MG tablet Take 25 mg by mouth daily.    [provider]    Family History Family History  Problem Relation Age of Onset  . Other Neg Hx     Social History Social History   Tobacco Use  . Smoking status: Never Smoker  . Smokeless tobacco: Never Used  Substance Use Topics  . Alcohol  use: No    Frequency: Never  . Drug use: No     Allergies   Amoxicillin and Betadine [povidone iodine]   Review of Systems Review of Systems  Musculoskeletal: Positive for neck pain.  Neurological: Positive for numbness. Negative for weakness.  All other systems reviewed and are negative.    Physical Exam Updated Vital Signs BP (!) 156/100   Pulse 75   Temp 97.7 F (36.5 C) (Oral)   Resp 14   SpO2 98%   Physical Exam  Constitutional: She is oriented to person, place, and time. She appears well-developed and well-nourished. No distress.  HENT:  Head: Normocephalic and atraumatic.  Right Ear: Hearing normal.  Left Ear: Hearing normal.  Nose: Nose normal.  Mouth/Throat: Oropharynx is clear and moist and mucous membranes are normal.  Eyes: Conjunctivae and EOM are normal. Pupils are  equal, round, and reactive to light.  Neck: Normal range of motion. Neck supple. Muscular tenderness present. Normal range of motion present.    Cardiovascular: Regular rhythm, S1 normal and S2 normal. Exam reveals no gallop and no friction rub.  No murmur heard. Pulmonary/Chest: Effort normal and breath sounds normal. No respiratory distress. She exhibits no tenderness.  Abdominal: Soft. Normal appearance and bowel sounds are normal. There is no hepatosplenomegaly. There is no tenderness. There is no rebound, no guarding, no tenderness at McBurney's point and negative Murphy's sign. No hernia.  Musculoskeletal: Normal range of motion.  Neurological: She is alert and oriented to person, place, and time. She has normal strength. No cranial nerve deficit or sensory deficit. Coordination normal. GCS eye subscore is 4. GCS verbal subscore is 5. GCS motor subscore is 6.  Biceps and triceps reflexes 2+ and symmetric bilaterally  Normal sensation to fine touch and proprioception bilaterally  Normal upper extremity strength bilaterally  Skin: Skin is warm, dry and intact. No rash noted. No cyanosis.  Psychiatric: Her speech is normal and behavior is normal. Thought content normal. Her mood appears anxious.  Nursing note and vitals reviewed.    ED Treatments / Results  Labs (all labs ordered are listed, but only abnormal results are displayed) Labs Reviewed  BASIC METABOLIC PANEL  CBC  I-STAT TROPONIN, ED  I-STAT BETA HCG BLOOD, ED ([facility], [facility], [facility] ONLY)    EKG  EKG Interpretation  Date/Time:  Sunday August 20 2017 22:40:34 EDT Ventricular Rate:  67 PR Interval:  98 QRS Duration: 86 QT Interval:  400 QTC Calculation: 422 R Axis:   76 Text Interpretation:  Sinus rhythm with short PR Otherwise normal ECG Confirmed by Gilda Crease [phone]) on 08/21/2017 12:41:06 AM       Radiology Dg Chest 2 View  Result Date: 08/20/2017 CLINICAL DATA:  Left shoulder and neck pain EXAM: CHEST -  2 VIEW COMPARISON:  01/20/2014 FINDINGS: The heart size and mediastinal contours are within normal limits. Both lungs are clear. The visualized skeletal structures are unremarkable. IMPRESSION: No active cardiopulmonary disease. Electronically Signed   By: Alcide Clever M.D.   On: 08/20/2017 23:22    Procedures Procedures (including critical care time)  Medications Ordered in ED Medications - No data to display   Initial Impression / Assessment and Plan / ED Course  I have reviewed the triage vital signs and the nursing notes.  Pertinent labs & imaging results that were available during my care of the patient were reviewed by me and considered in my medical decision making (see chart for details).  Patient presents with persistent left-sided neck pain for 3 weeks.  She has had some episodes of numbness in the arm as well.  She reports that the pain often radiates all the way down the arm to the tip of her middle finger.  No neurologic deficit is noted on exam.  She does not have any change in sensation in her strength is normal, reflexes are normal.  Patient concerned about heart attack.  She has no cardiac risk factors other than her father having heart disease at around the age of 43.  She is extremely low risk and her symptoms are extremely atypical, not felt to be related to cardiac etiology.  She has had a normal EKG and a normal troponin.  She is having a fair amount of pain associated with the numbness, pain is centered around the neck and shoulder area.  This is consistent with a radiculopathy, no concern for CVA.  She had some numbness around her face earlier which I think was from her anxiety, which she does have a history of.  I did offer CT or MRI of brain to ensure that she was not having a stroke or a "brain tumor", but patient felt comfortable following up with primary doctor.  I did not feel that she required any imaging tonight.  Final Clinical Impressions(s) / ED Diagnoses    Final diagnoses:  Cervical radiculopathy  Anxiety    ED Discharge Orders        Ordered    predniSONE (DELTASONE) 20 MG tablet     08/21/17 0120       Gilda Crease, MD 08/21/17 0124

## 2017-08-22 ENCOUNTER — Emergency Department (HOSPITAL_COMMUNITY): Payer: 59

## 2017-08-22 ENCOUNTER — Emergency Department (HOSPITAL_COMMUNITY)
Admission: EM | Admit: 2017-08-22 | Discharge: 2017-08-23 | Disposition: A | Discharge status: Home / Self Care | Payer: 59 | Attending: Emergency Medicine | Admitting: Emergency Medicine

## 2017-08-22 ENCOUNTER — Encounter (HOSPITAL_COMMUNITY): Payer: Self-pay

## 2017-08-22 DIAGNOSIS — R51 Headache: Secondary | ICD-10-CM | POA: Insufficient documentation

## 2017-08-22 DIAGNOSIS — R202 Paresthesia of skin: Secondary | ICD-10-CM | POA: Diagnosis not present

## 2017-08-22 DIAGNOSIS — R531 Weakness: Secondary | ICD-10-CM | POA: Diagnosis present

## 2017-08-22 DIAGNOSIS — Z79899 Other long term (current) drug therapy: Secondary | ICD-10-CM | POA: Diagnosis not present

## 2017-08-22 NOTE — ED Notes (Signed)
Patient transported to MRI 

## 2017-08-22 NOTE — ED Provider Notes (Signed)
Patient placed in Quick Look pathway, seen and evaluated   Chief Complaint: Left facial numbness and left arm weakness, headache  HPI:   Patient reports several weeks of left-sided patient numbness and pain in the left arm and shoulder.  She states that in the last several days, she has developed weakness in her left hand and arm, as well as tingling sensation in the face.  She was seen 2 days ago for the same, evaluation was unremarkable, however she was offered CT or MRI of her brain to rule out any potential brain lesions that could be causing her symptoms.  Patient did not want to wait at that time and was going to follow-up with her doctor, states today she has developed worsening headache and she called her doctor who told to come here.  She is taken Tylenol with no relief.  She does have history of Lyme disease but treated.  She also admits to anxiety.  ROS: positive for headache, numbness, weakness, arthralgia. Negative for neck pain, blurred vision, chest pain, sob  Physical Exam:   Gen: No distress  Neuro: Awake and Alert  Skin: Warm    Focused Exam: Alert and oriented x3.  Moving all extremities.  5/5 and equal upper and lower extremity strength bilaterally. Equal grip strength bilaterally. Normal finger to nose and heel to shin. No pronator drift. Patellar reflexes 2+.  Normal cranial nerves.  Patient appears to be very anxious.  She is requesting imaging of her brain.  I think in her case, with family history of ALS, progressive weakness in the left arm and the face, with associated headache, MRI would be more beneficial.  I discussed with Dr. Rush Landmarkegeler who agrees.  We will get MRI of the brain and cervical spine.  Vitals:   08/22/17 1907 08/22/17 1908  BP:  (!) 160/101  Pulse:  74  Resp:  18  Temp: 97.7 F (36.5 C) 97.7 F (36.5 C)  TempSrc:  Oral  SpO2:  97%  Weight: 79.4 kg (175 lb)   Height: 5\' 4"  (1.626 m)        Initiation of care has begun. The patient has been  counseled on the process, plan, and necessity for staying for the completion/evaluation, and the remainder of the medical screening examination    Jaynie CrumbleKirichenko, Lynda Capistran, Cordelia Poche-C 08/22/17 Georganna Skeans1938    Wickline, Donald, MD 08/22/17 [phone]

## 2017-08-22 NOTE — ED Notes (Signed)
Pt is in MRI at this time.

## 2017-08-22 NOTE — ED Triage Notes (Addendum)
Pt presents with severe HA x 3 days with L sided face tingling/numbness and dizziness x 1 week. Pt seen here for same yesterday and reports she was offered a CT but "decided just to f/u with primary care." Pt reports she called her PCP this AM and when she told her PCP her sx had not improved she was told to come back to the ED. Pt A&Ox4. Facial symmetry noted. Denies vision changes. Grips strong and equal bilaterally. Pt very anxious in triage
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
## 2017-10-30 ENCOUNTER — Other Ambulatory Visit: Payer: Self-pay | Admitting: Obstetrics and Gynecology

## 2017-10-30 DIAGNOSIS — N644 Mastodynia: Secondary | ICD-10-CM

## 2017-10-30 DIAGNOSIS — N6489 Other specified disorders of breast: Secondary | ICD-10-CM

## 2017-11-01 ENCOUNTER — Ambulatory Visit
Admission: RE | Admit: 2017-11-01 | Discharge: 2017-11-01 | Disposition: A | Discharge status: Home / Self Care | Payer: 59 | Source: Ambulatory Visit | Attending: Obstetrics and Gynecology | Admitting: Obstetrics and Gynecology

## 2017-11-01 DIAGNOSIS — N6489 Other specified disorders of breast: Secondary | ICD-10-CM

## 2017-11-01 DIAGNOSIS — N644 Mastodynia: Secondary | ICD-10-CM

## 2017-11-02 ENCOUNTER — Other Ambulatory Visit: Payer: 59

## 2017-11-03 ENCOUNTER — Other Ambulatory Visit: Payer: 59

## 2017-11-12 ENCOUNTER — Other Ambulatory Visit: Payer: Self-pay

## 2017-11-12 ENCOUNTER — Encounter (HOSPITAL_BASED_OUTPATIENT_CLINIC_OR_DEPARTMENT_OTHER): Payer: Self-pay | Admitting: Emergency Medicine

## 2017-11-12 ENCOUNTER — Emergency Department (HOSPITAL_BASED_OUTPATIENT_CLINIC_OR_DEPARTMENT_OTHER)
Admission: EM | Admit: 2017-11-12 | Discharge: 2017-11-12 | Disposition: A | Discharge status: Home / Self Care | Payer: 59 | Attending: Emergency Medicine | Admitting: Emergency Medicine

## 2017-11-12 DIAGNOSIS — M25512 Pain in left shoulder: Secondary | ICD-10-CM | POA: Diagnosis not present

## 2017-11-12 DIAGNOSIS — Z5321 Procedure and treatment not carried out due to patient leaving prior to being seen by health care provider: Secondary | ICD-10-CM | POA: Insufficient documentation

## 2017-11-12 NOTE — ED Triage Notes (Addendum)
L shoulder pain for several months. Worse over the last few days. Denies injury. Has been seen before and also had PT.

## 2017-12-30 ENCOUNTER — Encounter (HOSPITAL_COMMUNITY): Payer: Self-pay | Admitting: Emergency Medicine

## 2017-12-30 ENCOUNTER — Emergency Department (HOSPITAL_COMMUNITY): Payer: 59

## 2017-12-30 ENCOUNTER — Other Ambulatory Visit: Payer: Self-pay

## 2017-12-30 ENCOUNTER — Emergency Department (HOSPITAL_COMMUNITY)
Admission: EM | Admit: 2017-12-30 | Discharge: 2017-12-30 | Disposition: A | Discharge status: Home / Self Care | Payer: 59 | Attending: Emergency Medicine | Admitting: Emergency Medicine

## 2017-12-30 DIAGNOSIS — R072 Precordial pain: Secondary | ICD-10-CM | POA: Diagnosis not present

## 2017-12-30 DIAGNOSIS — R911 Solitary pulmonary nodule: Secondary | ICD-10-CM

## 2017-12-30 DIAGNOSIS — R079 Chest pain, unspecified: Secondary | ICD-10-CM | POA: Diagnosis present

## 2017-12-30 LAB — I-STAT TROPONIN, ED: TROPONIN I, POC: 0 ng/mL (ref 0.00–0.08)

## 2017-12-30 LAB — CBC WITH DIFFERENTIAL/PLATELET
ABS IMMATURE GRANULOCYTES: 0 10*3/uL (ref 0.0–0.1)
Basophils Absolute: 0 10*3/uL (ref 0.0–0.1)
Basophils Relative: 1 %
EOS ABS: 0.1 10*3/uL (ref 0.0–0.7)
Eosinophils Relative: 2 %
HCT: 45.1 % (ref 36.0–46.0)
Hemoglobin: 14.8 g/dL (ref 12.0–15.0)
IMMATURE GRANULOCYTES: 0 %
Lymphocytes Relative: 19 %
Lymphs Abs: 1.3 10*3/uL (ref 0.7–4.0)
MCH: 29.7 pg (ref 26.0–34.0)
MCHC: 32.8 g/dL (ref 30.0–36.0)
MCV: 90.4 fL (ref 78.0–100.0)
Monocytes Absolute: 0.3 10*3/uL (ref 0.1–1.0)
Monocytes Relative: 5 %
NEUTROS ABS: 4.8 10*3/uL (ref 1.7–7.7)
NEUTROS PCT: 73 %
PLATELETS: 230 10*3/uL (ref 150–400)
RBC: 4.99 MIL/uL (ref 3.87–5.11)
RDW: 12 % (ref 11.5–15.5)
WBC: 6.6 10*3/uL (ref 4.0–10.5)

## 2017-12-30 LAB — I-STAT CHEM 8, ED
BUN: 11 mg/dL (ref 6–20)
CREATININE: 0.9 mg/dL (ref 0.44–1.00)
Calcium, Ion: 1.1 mmol/L — ABNORMAL LOW (ref 1.15–1.40)
Chloride: 100 mmol/L (ref 98–111)
GLUCOSE: 170 mg/dL — AB (ref 70–99)
HCT: 38 % (ref 36.0–46.0)
HEMOGLOBIN: 12.9 g/dL (ref 12.0–15.0)
Potassium: 3.3 mmol/L — ABNORMAL LOW (ref 3.5–5.1)
Sodium: 140 mmol/L (ref 135–145)
TCO2: 28 mmol/L (ref 22–32)

## 2017-12-30 LAB — BASIC METABOLIC PANEL
Anion gap: 13 (ref 5–15)
BUN: 11 mg/dL (ref 6–20)
CALCIUM: 9.4 mg/dL (ref 8.9–10.3)
CO2: 24 mmol/L (ref 22–32)
Chloride: 104 mmol/L (ref 98–111)
Creatinine, Ser: 0.92 mg/dL (ref 0.44–1.00)
Glucose, Bld: 91 mg/dL (ref 70–99)
Potassium: 4 mmol/L (ref 3.5–5.1)
Sodium: 141 mmol/L (ref 135–145)

## 2017-12-30 LAB — I-STAT BETA HCG BLOOD, ED (MC, WL, AP ONLY): I-stat hCG, quantitative: <5 m[IU]/mL (ref <5)

## 2017-12-30 MED ORDER — IOPAMIDOL (ISOVUE-370) INJECTION 76%
100.0000 mL | Freq: Once | INTRAVENOUS | Status: AC | PRN
  Administered 2017-12-30: 100 mL via INTRAVENOUS

## 2017-12-30 MED ORDER — IBUPROFEN 800 MG PO TABS: 800.0000 mg | ORAL_TABLET | Freq: Three times a day (TID) | ORAL | 0 refills | Status: DC | PRN

## 2017-12-30 NOTE — Discharge Instructions (Addendum)
You have been seen in the Emergency Department (ED) today for chest pain.  As we have discussed today?s test results are normal, but you may require further testing.  Your CT scan does show a lung nodule in the right lung. This will need follow up with your PCP in 1 year for this.   Please follow up with the recommended doctor as instructed above in these documents regarding today?s emergent visit and your recent symptoms to discuss further management.  Continue to take your regular medications.   Return to the Emergency Department (ED) if you experience any further chest pain/pressure/tightness, difficulty breathing, or sudden sweating, or other symptoms that concern you.   Chest Pain (Nonspecific) It is often hard to give a specific diagnosis for the cause of chest pain. There is always a chance that your pain could be related to something serious, such as a heart attack or a blood clot in the lungs. You need to follow up with your health care provider for further evaluation. CAUSES  Heartburn. Pneumonia or bronchitis. Anxiety or stress. Inflammation around your heart (pericarditis) or lung (pleuritis or pleurisy). A blood clot in the lung. A collapsed lung (pneumothorax). It can develop suddenly on its own (spontaneous pneumothorax) or from trauma to the chest. Shingles infection (herpes zoster virus). The chest wall is composed of bones, muscles, and cartilage. Any of these can be the source of the pain. The bones can be bruised by injury. The muscles or cartilage can be strained by coughing or overwork. The cartilage can be affected by inflammation and become sore (costochondritis). DIAGNOSIS  Lab tests or other studies may be needed to find the cause of your pain. Your health care provider may have you take a test called an ambulatory electrocardiogram (ECG). An ECG records your heartbeat patterns over a 24-hour period. You may also have other tests, such as: Transthoracic  echocardiogram (TTE). During echocardiography, sound waves are used to evaluate how blood flows through your heart. Transesophageal echocardiogram (TEE). Cardiac monitoring. This allows your health care provider to monitor your heart rate and rhythm in real time. Holter monitor. This is a portable device that records your heartbeat and can help diagnose heart arrhythmias. It allows your health care provider to track your heart activity for several days, if needed. Stress tests by exercise or by giving medicine that makes the heart beat faster. TREATMENT  Treatment depends on what may be causing your chest pain. Treatment may include: Acid blockers for heartburn. Anti-inflammatory medicine. Pain medicine for inflammatory conditions. Antibiotics if an infection is present. You may be advised to change lifestyle habits. This includes stopping smoking and avoiding alcohol, caffeine, and chocolate. You may be advised to keep your head raised (elevated) when sleeping. This reduces the chance of acid going backward from your stomach into your esophagus. Most of the time, nonspecific chest pain will improve within 2-3 days with rest and mild pain medicine.  HOME CARE INSTRUCTIONS  If antibiotics were prescribed, take them as directed. Finish them even if you start to feel better. For the next few days, avoid physical activities that bring on chest pain. Continue physical activities as directed. Do not use any tobacco products, including cigarettes, chewing tobacco, or electronic cigarettes. Avoid drinking alcohol. Only take medicine as directed by your health care provider. Follow your health care provider's suggestions for further testing if your chest pain does not go away. Keep any follow-up appointments you made. If you do not go to an appointment, you  could develop lasting (chronic) problems with pain. If there is any problem keeping an appointment, call to reschedule. SEEK MEDICAL CARE IF:  Your  chest pain does not go away, even after treatment. You have a rash with blisters on your chest. You have a fever. SEEK IMMEDIATE MEDICAL CARE IF:  You have increased chest pain or pain that spreads to your arm, neck, jaw, back, or abdomen. You have shortness of breath. You have an increasing cough, or you cough up blood. You have severe back or abdominal pain. You feel nauseous or vomit. You have severe weakness. You faint. You have chills. This is an emergency. Do not wait to see if the pain will go away. Get medical help at once. Call your local emergency services (911 in [place] Do not drive yourself to the hospital. MAKE SURE YOU:  Understand these instructions. Will watch your condition. Will get help right away if you are not doing well or get worse. Document Released: 03/09/2005 Document Revised: 06/04/2013 Document Reviewed: 01/03/2008 [facility] Patient Information 2015 [place], [place]. This information is not intended to replace advice given to you by your health care provider. Make sure you discuss any questions you have with your health care provider.

## 2017-12-30 NOTE — ED Notes (Signed)
Patient transported to CT 

## 2017-12-30 NOTE — ED Notes (Signed)
Patient is alert and orientedx4.  Patient was explained discharge instructions and they understood them with no questions.  The patient's husband is taking her home. 

## 2017-12-30 NOTE — ED Provider Notes (Signed)
Emergency Department Provider Note   I have reviewed the triage vital signs and the nursing notes.   HISTORY  Chief Complaint Chest Pain   HPI Clarene DukeKrystal Y Hill is a 32 y.o. female with PMH of anxiety and recent left shoulder arthroscopy resents to the emergency department for evaluation of left-sided chest/shoulder pain with arm redness and dyspnea.  [facility] and was referred here for CT scan to rule out PE.  She states they performed an x-ray and found some nonspecific findings but advised emergent CT follow-up.  Patient states her dyspnea has resolved.  She describes her chest pain as "burning" and denies any pleuritic quality to pain.  No fevers or chills.  Denies any hemoptysis.  No prior history of PE or DVT.  She does have redness in the left upper arm but states this is been present since surgery and she was told by her orthopedist that this is likely from the ice pack she is been wearing on the shoulder.   Past Medical History:  Diagnosis Date  . Anxiety   . Mild preeclampsia 08/07/2012  . PONV (postoperative nausea and vomiting)   . Pregnancy induced hypertension   . SVD (spontaneous vaginal delivery) 03/22/2017    Patient Active Problem List   Diagnosis Date Noted  . Indication for care in labor or delivery 03/22/2017  . Infectious disease affecting pregnancy in third trimester, antepartum 03/22/2017  . SVD (spontaneous vaginal delivery) 03/22/2017  . Mild preeclampsia 08/07/2012    Past Surgical History:  Procedure Laterality Date  . NOSE SURGERY    . WISDOM TOOTH EXTRACTION       Allergies Amoxicillin; Prednisone; Eggs or egg-derived products; Pantoprazole; and Betadine [povidone iodine]  Family History  Problem Relation Age of Onset  . Other Neg Hx     Social History Social History   Tobacco Use  . Smoking status: Never Smoker  . Smokeless tobacco: Never Used  Substance Use Topics  . Alcohol use: No   Frequency: Never  . Drug use: No    Review of Systems  Constitutional: No fever/chills Eyes: No visual changes. ENT: No sore throat. Cardiovascular: Positive chest pain. Respiratory: Denies shortness of breath. Gastrointestinal: No abdominal pain.  No nausea, no vomiting.  No diarrhea.  No constipation. Genitourinary: Negative for dysuria. Musculoskeletal: Negative for back pain. Positive left upper arm pain.  Skin: Negative for rash. Neurological: Negative for headaches, focal weakness or numbness.  10-point ROS otherwise negative.  ____________________________________________   PHYSICAL EXAM:  VITAL SIGNS: ED Triage Vitals  Enc Vitals Group     BP 12/30/17 1653 (!) 135/97     Pulse Rate 12/30/17 1653 85     Resp 12/30/17 1653 16     Temp 12/30/17 1653 97.8 F (36.6 C)     Temp Source 12/30/17 1653 Oral     SpO2 12/30/17 1653 99 %     Weight 12/30/17 1656 158 lb (71.7 kg)     Pain Score 12/30/17 1655 4   Constitutional: Alert and oriented. Well appearing and in no acute distress. Eyes: Conjunctivae are normal. Head: Atraumatic. Nose: No congestion/rhinnorhea. Mouth/Throat: Mucous membranes are moist.  Oropharynx non-erythematous. Neck: No stridor.   Cardiovascular: Normal rate, regular rhythm. Good peripheral circulation. Grossly normal heart sounds.   Respiratory: Normal respiratory effort.  No retractions. Lungs CTAB. Gastrointestinal: Soft and nontender. No distention.  Musculoskeletal: No lower extremity tenderness nor edema. No gross deformities of extremities. Erythema and warmth  to touch over the LUE. Erythema with sharp demarcation and in the territory of the patient's ice wrap used at home.  Neurologic:  Normal speech and language. No gross focal neurologic deficits are appreciated.  Skin:  Skin is warm, dry and intact. No rash noted.   ____________________________________________   LABS (all labs ordered are listed, but only abnormal results are  displayed)  Labs Reviewed  I-STAT CHEM 8, ED - Abnormal; Notable for the following components:      Result Value   Potassium 3.3 (*)    Glucose, Bld 170 (*)    Calcium, Ion 1.10 (*)    All other components within normal limits  BASIC METABOLIC PANEL  CBC WITH DIFFERENTIAL/PLATELET  I-STAT BETA HCG BLOOD, ED ([facility], [facility], [facility] ONLY)  I-STAT TROPONIN, ED   ____________________________________________  EKG   EKG Interpretation  Date/Time:  Saturday December 30 2017 16:51:18 EDT Ventricular Rate:  76 PR Interval:    QRS Duration: 90 QT Interval:  405 QTC Calculation: 456 R Axis:   2 Text Interpretation:  Sinus rhythm Borderline T abnormalities, diffuse leads No STEMI.  Confirmed by Alona Bene [phone]) on 12/30/2017 5:50:40 PM       ____________________________________________  RADIOLOGY  Ct Angio Chest Pe W And/or Wo Contrast  Result Date: 12/30/2017 CLINICAL DATA:  Chest pain.  Recent shoulder surgery. EXAM: CT ANGIOGRAPHY CHEST WITH CONTRAST TECHNIQUE: Multidetector CT imaging of the chest was performed using the standard protocol during bolus administration of intravenous contrast. Multiplanar CT image reconstructions and MIPs were obtained to evaluate the vascular anatomy. CONTRAST:  70 mL ISOVUE-370 IOPAMIDOL (ISOVUE-370) INJECTION 76% COMPARISON:  Chest radiograph August 20, 2017 FINDINGS: Cardiovascular: There is no demonstrable pulmonary embolus. There is no thoracic aortic aneurysm or dissection. Visualized great vessels appear normal. There is no pericardial effusion or pericardial thickening evident. Mediastinum/Nodes: Visualized thyroid appears normal. There is no appreciable thoracic adenopathy. No esophageal lesions are evident. Lungs/Pleura: There is no edema or consolidation. There is no pericardial effusion or pericardial thickening. On axial slice 49 series 7, there is a 3 mm nodular opacity in the medial segment of the right middle lobe. Upper Abdomen: Visualized upper  abdominal structures appear normal. Musculoskeletal: There is mild thoracic dextroscoliosis. There are no blastic or lytic bone lesions. No chest wall lesions are appreciable. Review of the MIP images confirms the above findings. IMPRESSION: 1. No demonstrable pulmonary embolus. No thoracic aortic aneurysm or dissection. 2. There is no evident lung edema or consolidation. There is a 3 mm nodular opacity in the medial segment right middle lobe. No follow-up needed if patient is low-risk. Non-contrast chest CT can be considered in 12 months if patient is high-risk. This recommendation follows the consensus statement: Guidelines for Management of Incidental Pulmonary Nodules Detected on CT Images: From the Fleischner Society 2017; Radiology 2017; 284:228-243. 3.  No evident thoracic adenopathy. Electronically Signed   By: Bretta Bang III M.D.   On: 12/30/2017 19:32    ____________________________________________   PROCEDURES  Procedure(s) performed:   Procedures  None ____________________________________________   INITIAL IMPRESSION / ASSESSMENT AND PLAN / ED COURSE  Pertinent labs & imaging results that were available during my care of the patient were reviewed by me and considered in my medical decision making (see chart for details).  Vision presents to the emergency department for evaluation of PE rule out.  She did have a recent left shoulder surgery.  She has some redness in this area which has been persistent and  not significantly worsening.  Very low suspicion for left upper extremity DVT.  She is afebrile with no hemoptysis.  Plan for CT Angio of the chest, labs, reassess.  EKG reviewed with no acute findings.  Labs reviewed with no acute findings.  Troponin negative.  EKG unremarkable.  CT angio shows no pulmonary embolism. The patient has right-sided pulmonary nodules which I discussed with her.  She will follow-up with her PCP for monitoring as an outpatient.  Will discharge  home with Motrin for pain and close follow-up with orthopedics. No concern for cellulitis of the left arm/shoulder. Continue to monitor.   At this time, I do not feel there is any life-threatening condition present. I have reviewed and discussed all results (EKG, imaging, lab, urine as appropriate), exam findings with patient. I have reviewed nursing notes and appropriate previous records.  I feel the patient is safe to be discharged home without further emergent workup. Discussed usual and customary return precautions. Patient and family (if present) verbalize understanding and are comfortable with this plan.  Patient will follow-up with their primary care provider. If they do not have a primary care provider, information for follow-up has been provided to them. All questions have been answered.  ____________________________________________  FINAL CLINICAL IMPRESSION(S) / ED DIAGNOSES  Final diagnoses:  Precordial chest pain  Pulmonary nodule    MEDICATIONS GIVEN DURING THIS VISIT:  Medications  iopamidol (ISOVUE-370) 76 % injection 100 mL (100 mLs Intravenous Contrast Given 12/30/17 1851)    Note:  This document was prepared using Dragon voice recognition software and may include unintentional dictation errors.  Alona Bene, MD Emergency Medicine    Kasandra Fehr, Arlyss Repress, MD 12/30/17 [phone]

## 2017-12-30 NOTE — ED Triage Notes (Signed)
Pt [facility] via EMS [facility], for possible PE. Shoulder surgery 7/12, now having chest pain (states it started 2 weeks ago as burning of chest. After surgery developed SOB and called Dr and told it was prob from nerve block from surgery. Pt taking acid reflux with no help, [facility] and [facility], and sent here via EMS. Pain 4/10, pt anxious during triage. 130/70, HR 96, 98% room air, 20 RFA sl. CBG 99.

## 2018-04-26 IMAGING — US US MFM MCA DOPPLER
1 series · 15 of 27 positions shown · non-contrast
Comparison: none

[Series 1: us mfm mca doppler · 15 of 27 slices shown]
[im 1/27]
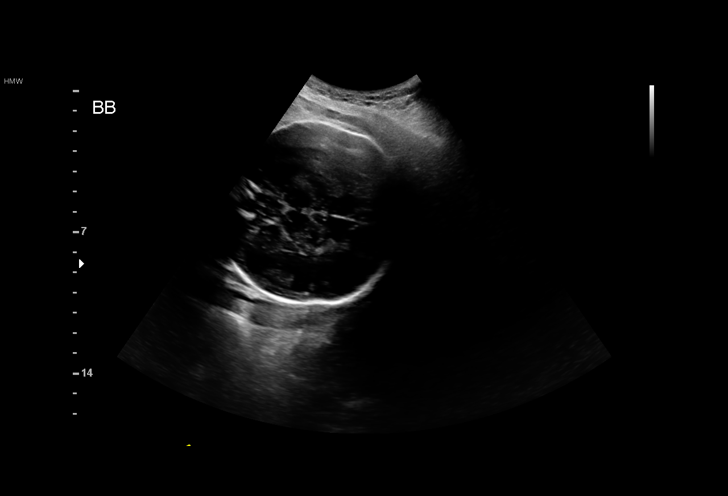
[im 3/27]
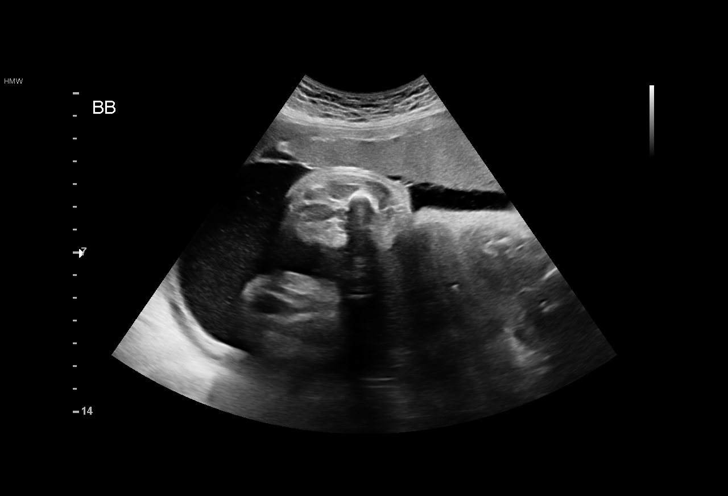
[im 5/27]
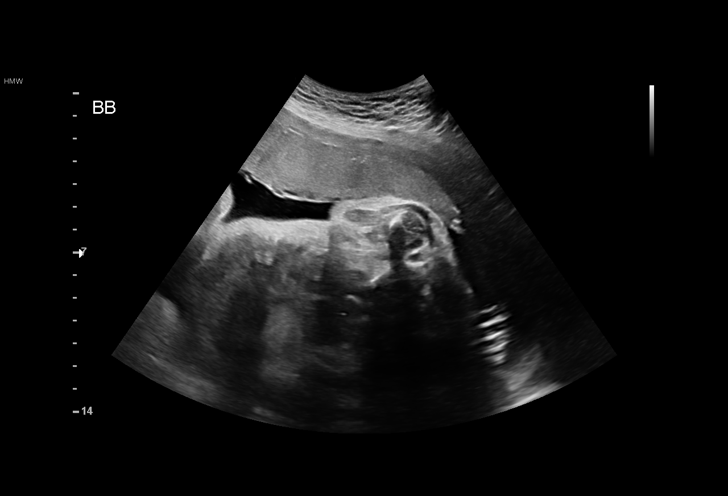
[im 7/27]
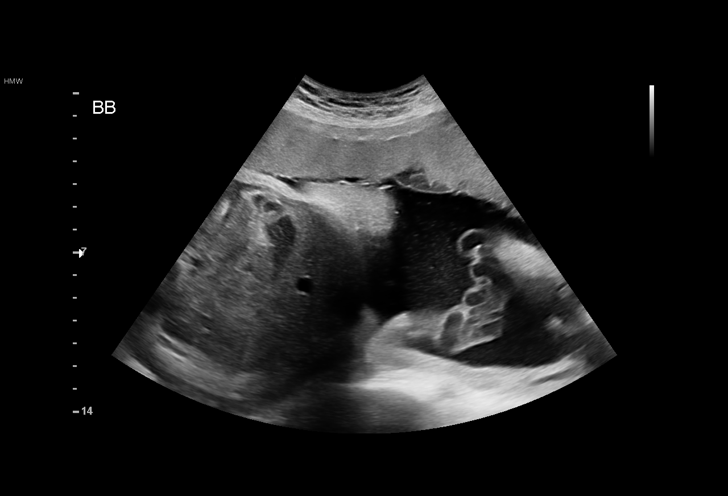
[im 9/27]
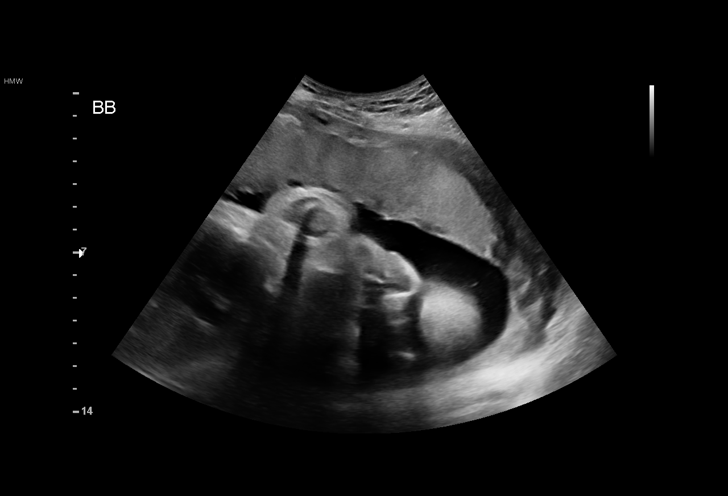
[im 10/27]
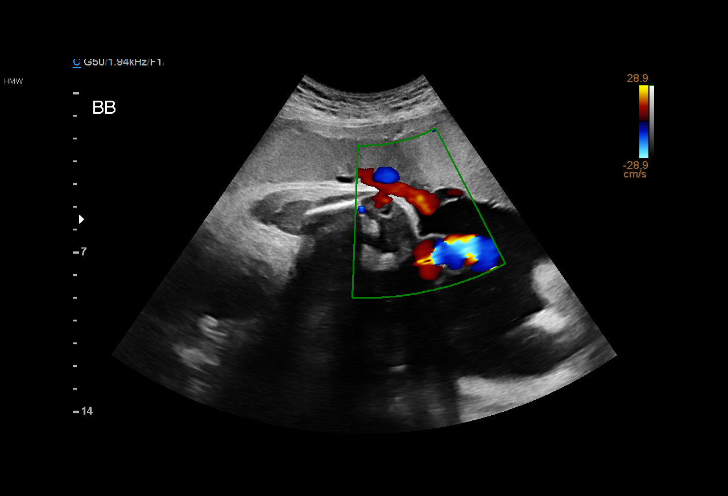
[im 12/27]
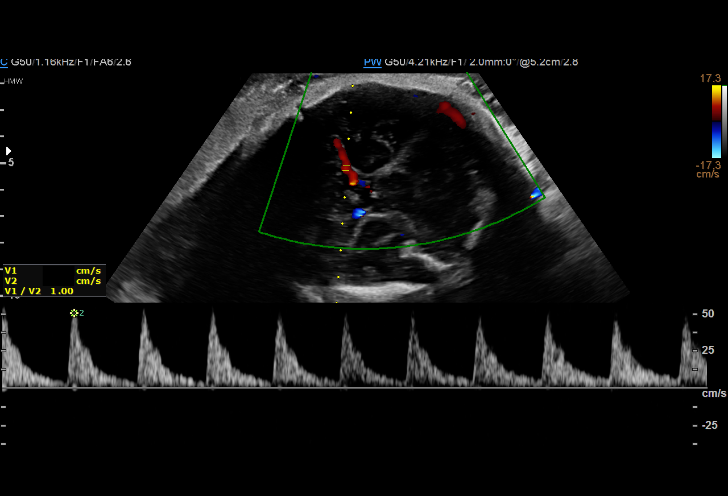
[im 14/27]
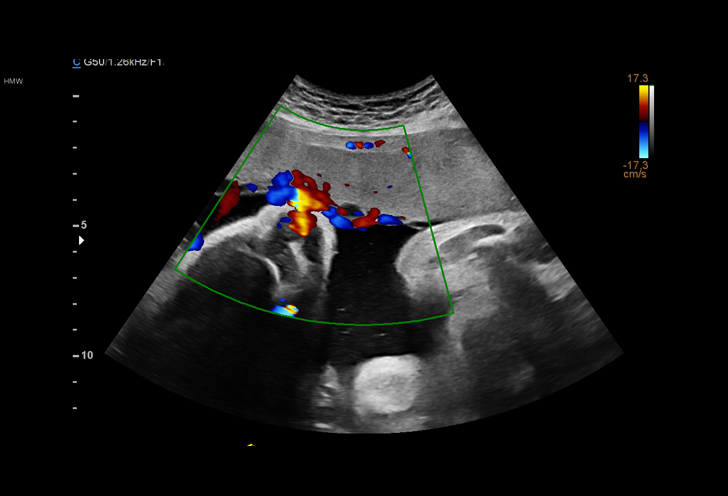
[im 16/27]
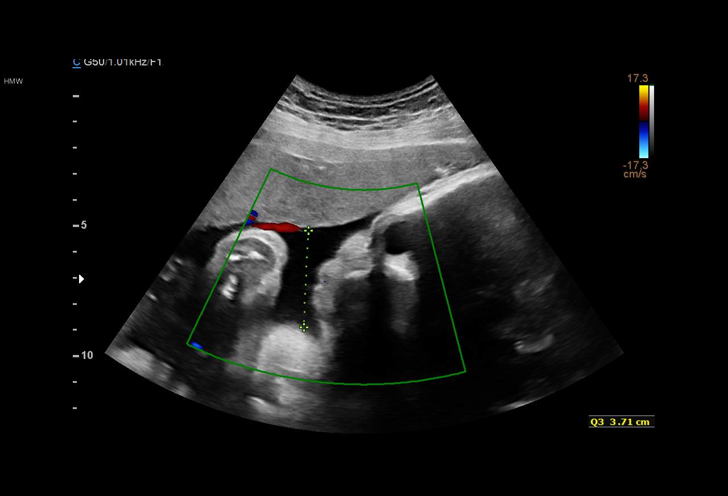
[im 18/27]
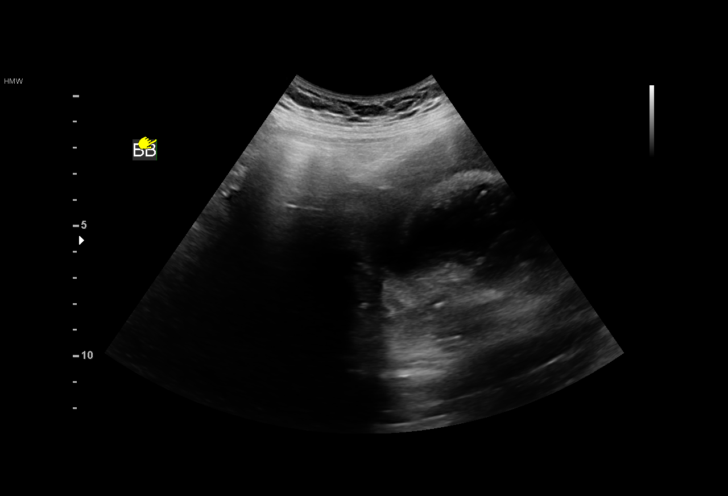
[im 19/27]
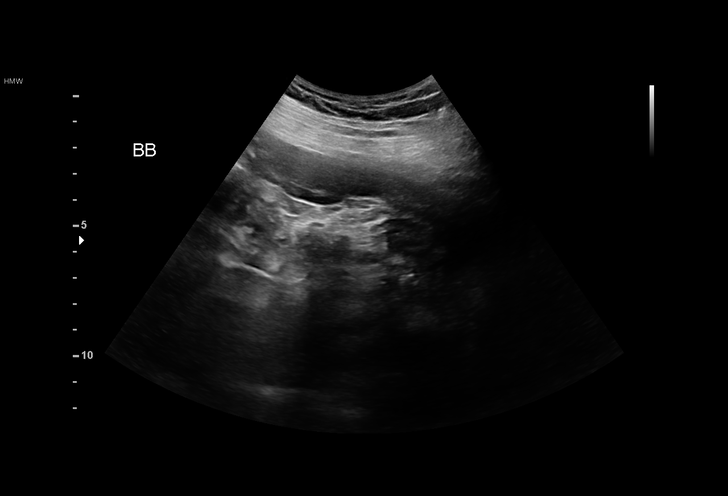
[im 21/27]
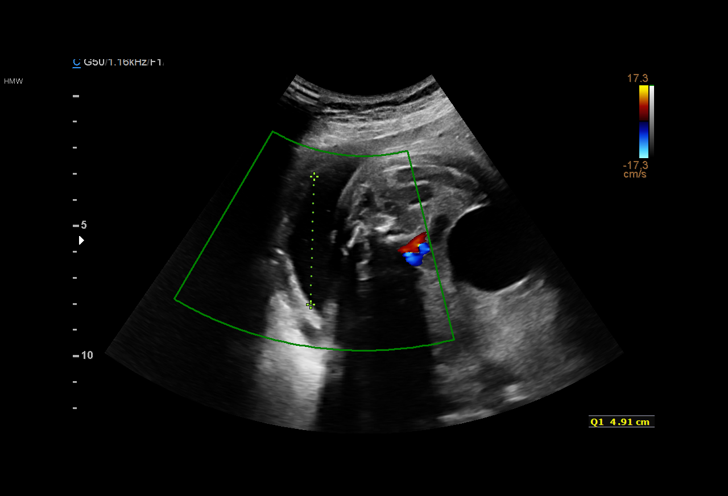
[im 23/27]
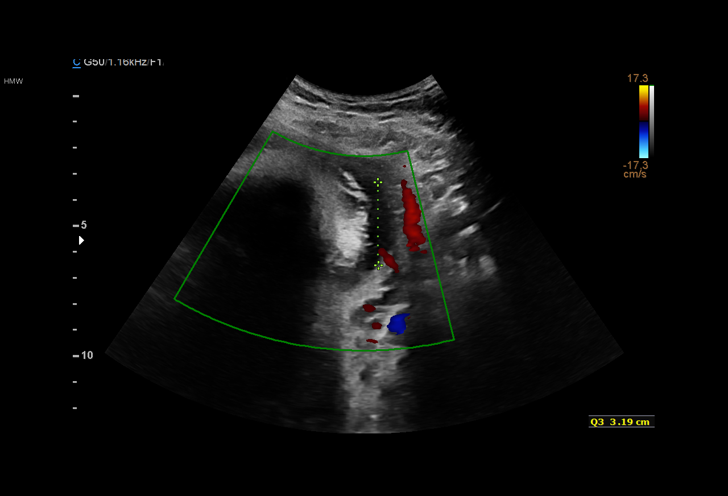
[im 25/27]
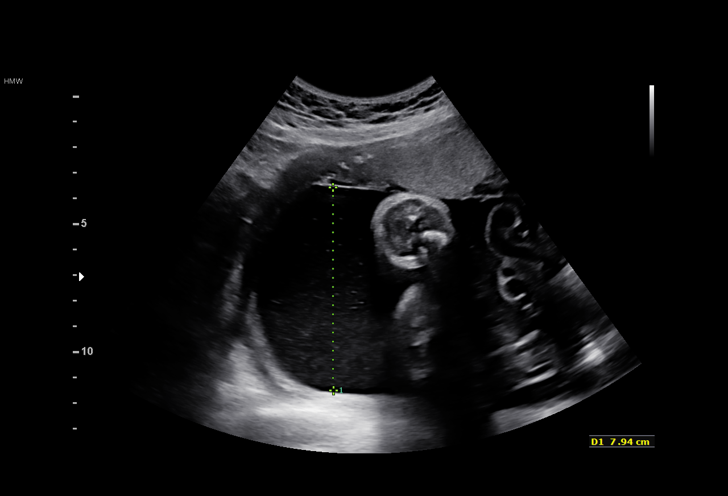
[im 27/27]
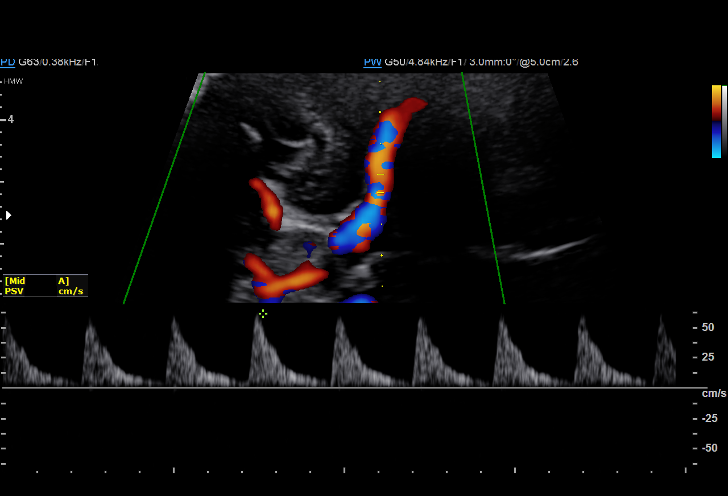

[15 of 27 positions shown; findings below may reference images not displayed]

[HOSPITAL[facility]
Inc[facility]

1  US MFM MCA DOPPLER                          76821.01

1  ARNT JOHAN NUUH            [id number]      [id number]     [id number]
Indications

35 weeks gestation of pregnancy
Maternal viral disease, antepartum,
Parvovirus (exposed 12/19/16 from son +IgM
17 and IgG low positive 5.8) BMZ [DATE] & [DATE]
OB History

Blood Type:            Height:  5'4"   Weight (lb):  152      BMI:
Gravidity:    3         Term:   2
Living:       2
Fetal Evaluation

Num Of Fetuses:     1
Fetal Heart         130
Rate(bpm):
Cardiac Activity:   Observed
Presentation:       Cephalic

Amniotic Fluid
AFI FV:      Subjectively within normal limits

AFI Sum(cm)     %Tile       Largest Pocket(cm)
14.73           53

RUQ(cm)       RLQ(cm)       LUQ(cm)        LLQ(cm)
4.91
Gestational Age

LMP:           35w 0d       Date:   06/22/16                 EDD:   03/29/17
Best:          35w 0d    Det. By:   LMP  (06/22/16)          EDD:   03/29/17
Doppler - Fetal Vessels

Middle Cerebral Artery
PSV   MoM
(cm/s)
62.2

Impression

SIUP at 35+0 weeks
Cephalic presentation
Normal amniotic fluid volume
MCA dopplers indicate low risk for fetal anemia
No hydrops
Recommendations

Continue weekly MCA dopplers

## 2018-05-10 IMAGING — US US MFM MCA DOPPLER
1 series · 14 of 15 positions shown · non-contrast
Comparison: none

[Series 1: us mfm mca doppler · 14 of 15 slices shown]
[im 1/15]
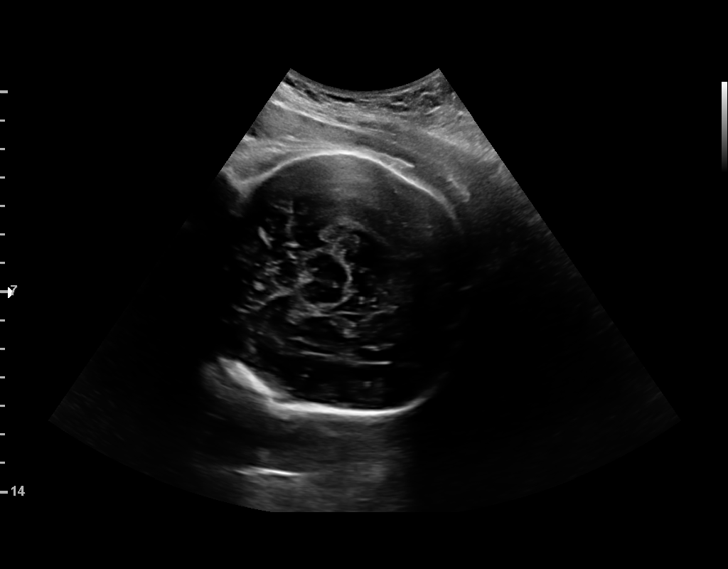
[im 2/15]
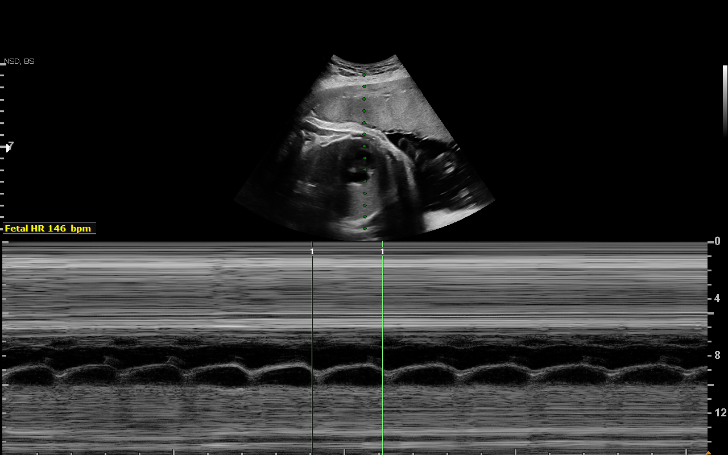
[im 3/15]
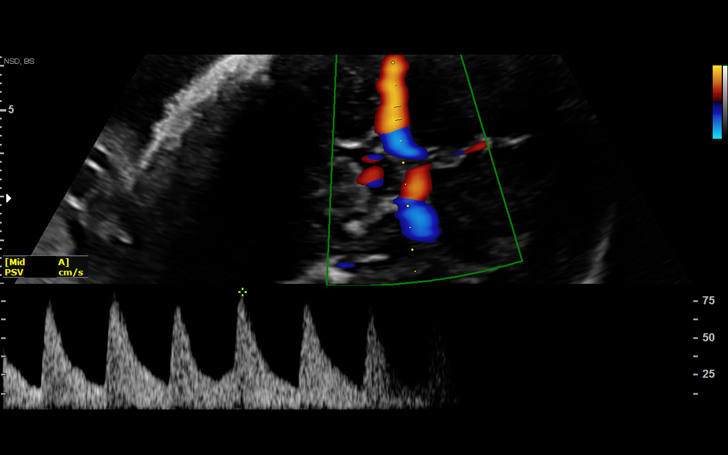
[im 4/15]
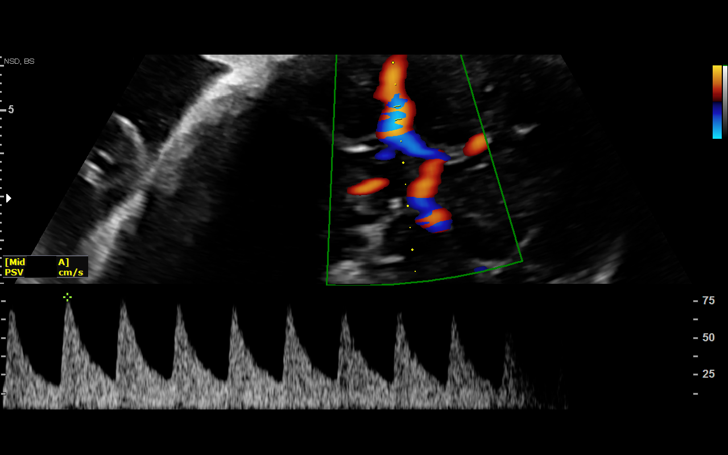
[im 5/15]
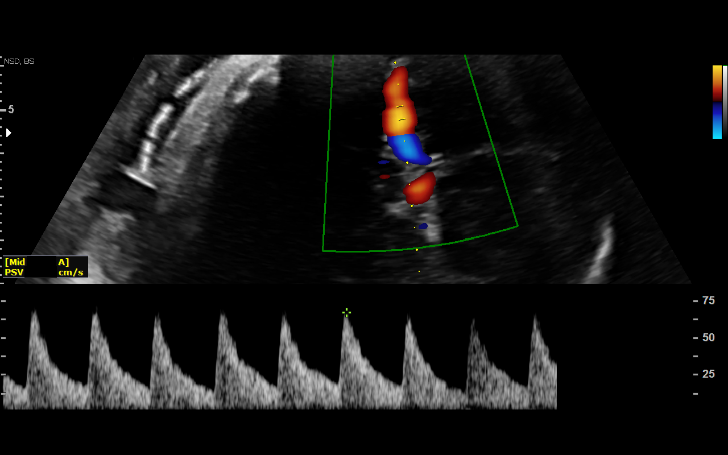
[im 6/15]
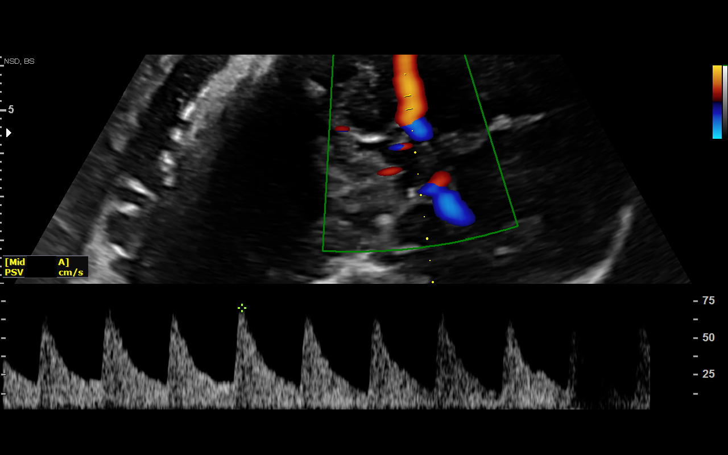
[im 7/15]
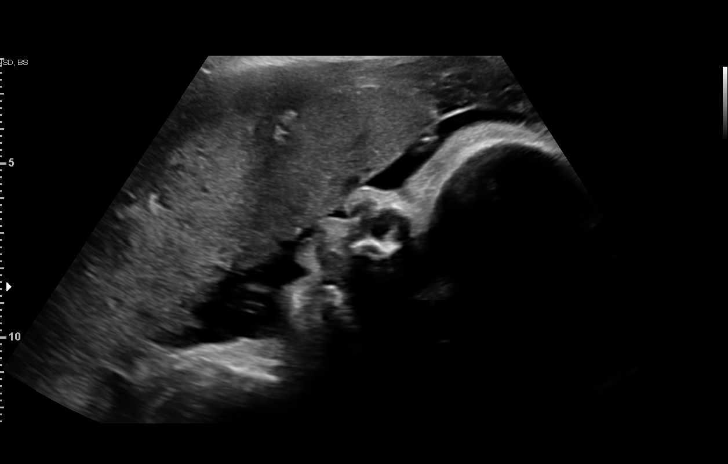
[im 9/15]
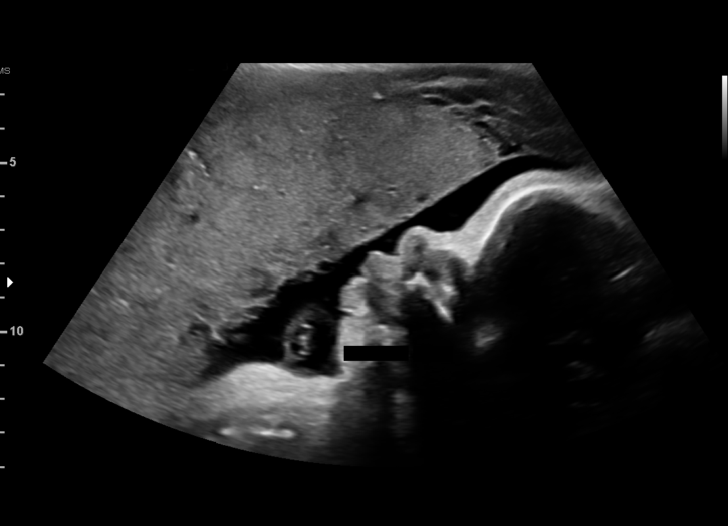
[im 10/15]
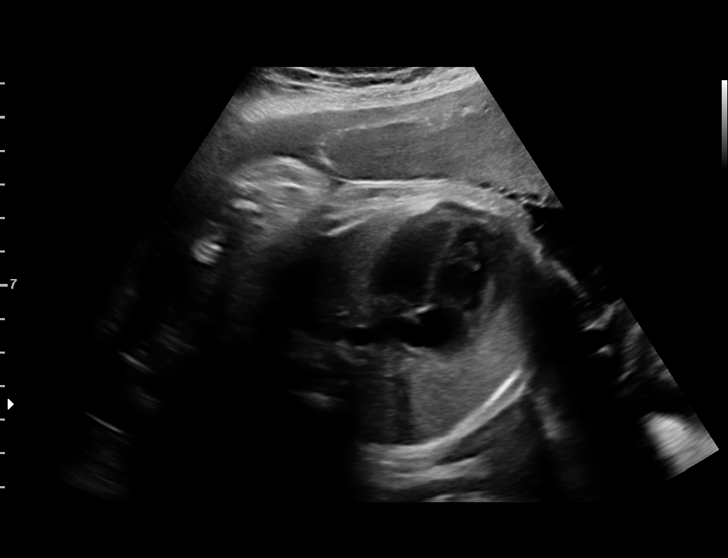
[im 11/15]
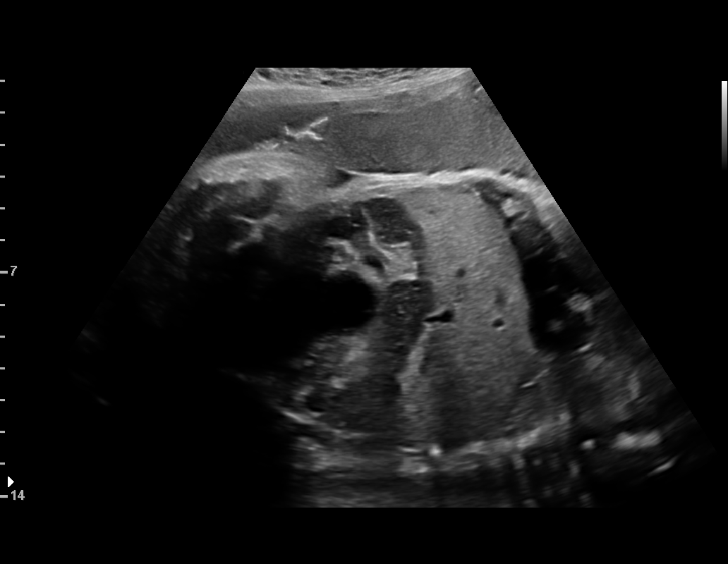
[im 12/15]
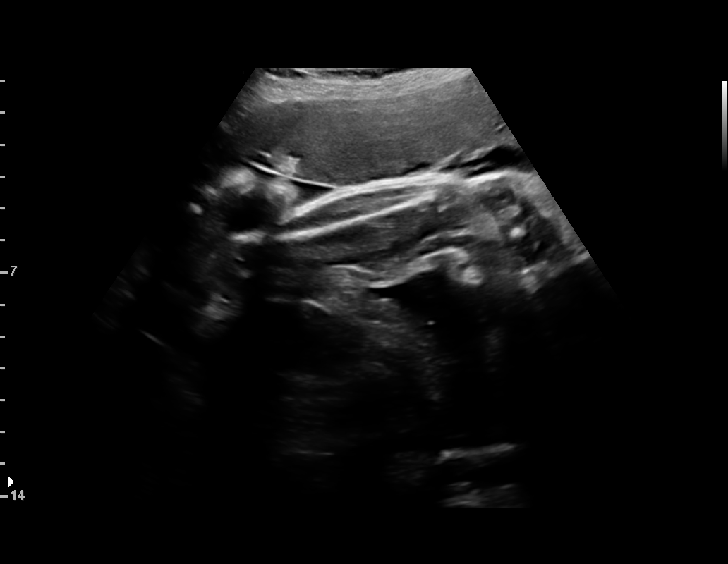
[im 13/15]
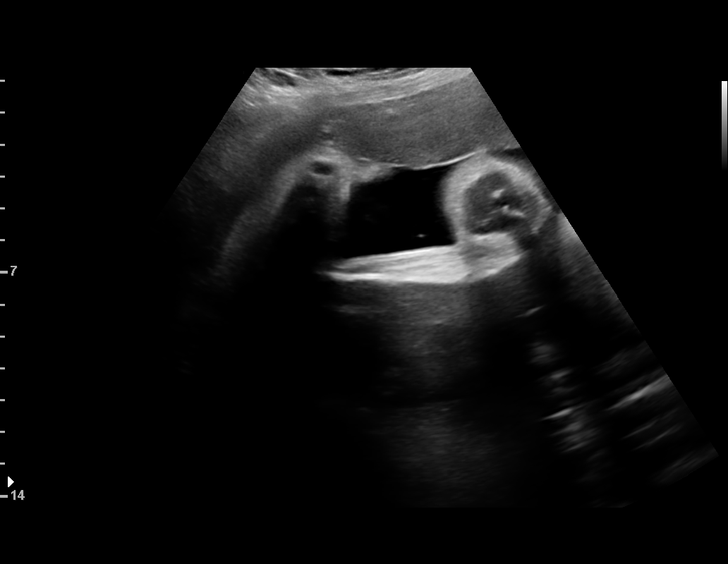
[im 14/15]
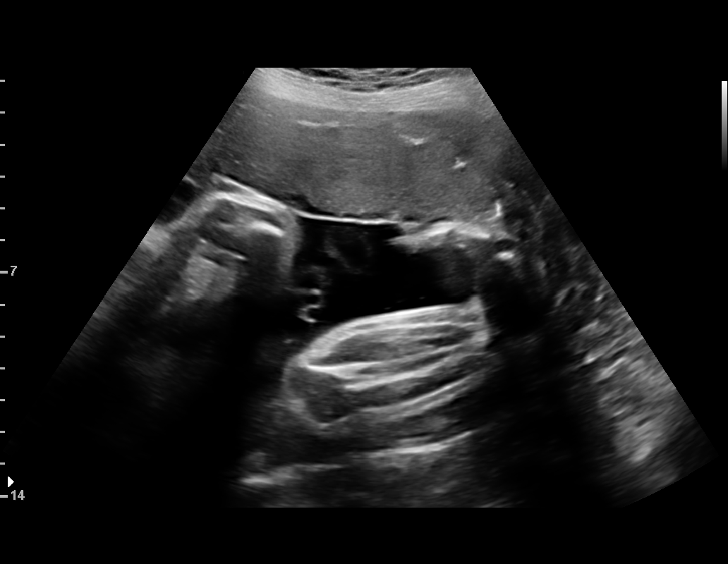
[im 15/15]
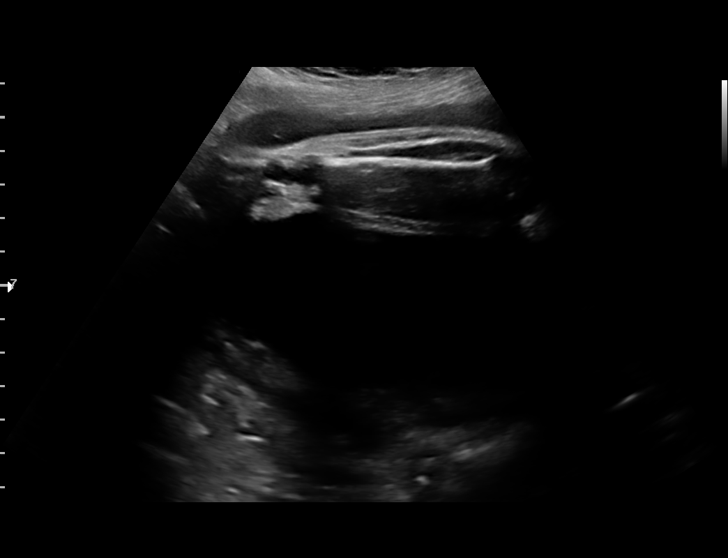

[14 of 15 positions shown; findings below may reference images not displayed]

[HOSPITAL[facility]
Inc[facility]

1  US MFM MCA DOPPLER                          76821.01

1  THO RADILLA            [id number]      [id number]     [id number]
Indications

37 weeks gestation of pregnancy
Maternal viral disease, antepartum,
Parvovirus (exposed 12/19/16 from son +IgM
17 and IgG low positive 5.8) BMZ [DATE] & [DATE]
OB History

Blood Type:            Height:  5'4"   Weight (lb):  152      BMI:
Gravidity:    3         Term:   2
Living:       2
Fetal Evaluation

Num Of Fetuses:     1
Fetal Heart         146
Rate(bpm):
Cardiac Activity:   Observed
Presentation:       Cephalic

Amniotic Fluid
AFI FV:      Subjectively within normal limits

AFI Sum(cm)     %Tile       Largest Pocket(cm)
18.64           71

RUQ(cm)       RLQ(cm)       LUQ(cm)        LLQ(cm)
3.77
Gestational Age

LMP:           37w 0d       Date:   06/22/16                 EDD:   03/29/17
Best:          37w 0d    Det. By:   LMP  (06/22/16)          EDD:   03/29/17
Doppler - Fetal Vessels

Middle Cerebral Artery
PSV   MoM
(cm/s)
78.03

Impression

SIUP at 37+0 weeks with Parvo B-19 exposure
Cephalic presentation
Normal amniotic fluid volume
MCA dopplers are 1.41 MoM, indicating low risk for fetal
anemia
No hydrops
Recommendations

Continue weekly MCA dopplers

## 2018-11-12 ENCOUNTER — Other Ambulatory Visit: Payer: Self-pay | Admitting: Otolaryngology

## 2018-11-12 DIAGNOSIS — R221 Localized swelling, mass and lump, neck: Secondary | ICD-10-CM

## 2019-01-03 ENCOUNTER — Other Ambulatory Visit: Payer: Self-pay | Admitting: Nurse Practitioner

## 2019-01-03 DIAGNOSIS — Z20822 Contact with and (suspected) exposure to covid-19: Secondary | ICD-10-CM

## 2019-01-06 LAB — NOVEL CORONAVIRUS, NAA: SARS-CoV-2, NAA: NOT DETECTED

## 2019-07-20 ENCOUNTER — Ambulatory Visit: Payer: Self-pay | Attending: Internal Medicine

## 2019-07-20 DIAGNOSIS — Z23 Encounter for immunization: Secondary | ICD-10-CM | POA: Insufficient documentation

## 2019-07-20 NOTE — Progress Notes (Signed)
   Covid-19 Vaccination Clinic  Name:  Hannah Hill    MRN: [id number] DOB: [birth year]  07/20/2019  Hannah Hill was observed post Covid-19 immunization for 15 minutes without incidence. She was provided with Vaccine Information Sheet and instruction to access the V-Safe system.   Hannah Hill was instructed to call 911 with any severe reactions post vaccine: Marland Kitchen Difficulty breathing  . Swelling of your face and throat  . A fast heartbeat  . A bad rash all over your body  . Dizziness and weakness    Immunizations Administered    Name Date Dose VIS Date Route   Pfizer COVID-19 Vaccine 07/20/2019  6:43 PM 0.3 mL 05/24/2019 Intramuscular   Manufacturer: [employer], [employer]   Lot: [id number]   NDC: [address]-2

## 2019-08-14 ENCOUNTER — Ambulatory Visit: Payer: Self-pay | Attending: Internal Medicine

## 2019-08-14 DIAGNOSIS — Z23 Encounter for immunization: Secondary | ICD-10-CM | POA: Insufficient documentation

## 2019-08-14 NOTE — Progress Notes (Signed)
   Covid-19 Vaccination Clinic  Name:  Hannah Hill    MRN: [id number] DOB: 02/18/1986  08/14/2019  [name] was observed post Covid-19 immunization for 15 minutes without incident. She was provided with Vaccine Information Sheet and instruction to access the V-Safe system.   [name] was instructed to call 911 with any severe reactions post vaccine: Marland Kitchen Difficulty breathing  . Swelling of face and throat  . A fast heartbeat  . A bad rash all over body  . Dizziness and weakness   Immunizations Administered    Name Date Dose VIS Date Route   Pfizer COVID-19 Vaccine 08/14/2019  3:58 PM 0.3 mL 05/24/2019 Intramuscular   Manufacturer: [employer], [employer]   Lot: [id number]   NDC: [address]-1

## 2023-11-23 LAB — OB RESULTS CONSOLE HEPATITIS B SURFACE ANTIGEN: Hepatitis B Surface Ag: NEGATIVE

## 2023-11-23 LAB — OB RESULTS CONSOLE GC/CHLAMYDIA
Chlamydia: NEGATIVE
Neisseria Gonorrhea: NEGATIVE

## 2023-11-23 LAB — OB RESULTS CONSOLE HIV ANTIBODY (ROUTINE TESTING): HIV: NONREACTIVE

## 2023-11-23 LAB — OB RESULTS CONSOLE RUBELLA ANTIBODY, IGM: Rubella: IMMUNE

## 2023-11-23 LAB — OB RESULTS CONSOLE RPR: RPR: NONREACTIVE

## 2023-11-23 LAB — HEPATITIS C ANTIBODY: HCV Ab: NEGATIVE

## 2023-11-23 LAB — OB RESULTS CONSOLE ANTIBODY SCREEN: Antibody Screen: NEGATIVE

## 2023-11-26 LAB — LAB REPORT - SCANNED
Creatinine, POC: 119.7 mg/dL
EGFR: 100
HM HIV Screening: NEGATIVE
HM Hepatitis Screen: NEGATIVE

## 2023-12-22 LAB — LAB REPORT - SCANNED
Creatinine, POC: 71.4 mg/dL
EGFR: 115

## 2024-01-04 ENCOUNTER — Encounter (HOSPITAL_BASED_OUTPATIENT_CLINIC_OR_DEPARTMENT_OTHER): Payer: Self-pay

## 2024-01-04 ENCOUNTER — Other Ambulatory Visit: Payer: Self-pay

## 2024-01-04 ENCOUNTER — Emergency Department (HOSPITAL_BASED_OUTPATIENT_CLINIC_OR_DEPARTMENT_OTHER)
Admission: EM | Admit: 2024-01-04 | Discharge: 2024-01-05 | Disposition: A | Discharge status: Home / Self Care | Attending: Emergency Medicine | Admitting: Emergency Medicine

## 2024-01-04 DIAGNOSIS — R6884 Jaw pain: Secondary | ICD-10-CM | POA: Insufficient documentation

## 2024-01-04 DIAGNOSIS — Z3A15 15 weeks gestation of pregnancy: Secondary | ICD-10-CM | POA: Insufficient documentation

## 2024-01-04 DIAGNOSIS — O9A212 Injury, poisoning and certain other consequences of external causes complicating pregnancy, second trimester: Secondary | ICD-10-CM | POA: Diagnosis not present

## 2024-01-04 DIAGNOSIS — W51XXXA Accidental striking against or bumped into by another person, initial encounter: Secondary | ICD-10-CM | POA: Diagnosis not present

## 2024-01-04 DIAGNOSIS — S0990XA Unspecified injury of head, initial encounter: Secondary | ICD-10-CM | POA: Diagnosis not present

## 2024-01-04 DIAGNOSIS — O26892 Other specified pregnancy related conditions, second trimester: Secondary | ICD-10-CM | POA: Diagnosis present

## 2024-01-04 MED ORDER — HYDROCHLOROTHIAZIDE 25 MG PO TABS: 25.0000 mg | ORAL_TABLET | Freq: Once | ORAL | Status: DC

## 2024-01-04 MED ORDER — ACETAMINOPHEN 500 MG PO TABS
1000.0000 mg | ORAL_TABLET | Freq: Once | ORAL | Status: AC
  Administered 2024-01-04: 1000 mg via ORAL
  Filled 2024-01-04: qty 2

## 2024-01-04 MED ORDER — AMLODIPINE BESYLATE 5 MG PO TABS: 5.0000 mg | ORAL_TABLET | Freq: Once | ORAL | Status: DC
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
## 2024-01-04 NOTE — ED Notes (Signed)
 Patient given gingerale for PO challenge.
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
## 2024-03-30 LAB — LAB REPORT - SCANNED: HM HIV Screening: NEGATIVE

## 2024-04-25 LAB — LAB REPORT - SCANNED
Creatinine, POC: 237 mg/dL
EGFR: 90

## 2024-05-01 LAB — LAB REPORT - SCANNED: EGFR: 114

## 2024-05-22 LAB — LAB REPORT - SCANNED: EGFR: 114

## 2024-05-23 ENCOUNTER — Other Ambulatory Visit: Payer: Self-pay | Admitting: Student

## 2024-05-23 DIAGNOSIS — O10919 Unspecified pre-existing hypertension complicating pregnancy, unspecified trimester: Secondary | ICD-10-CM

## 2024-05-24 ENCOUNTER — Telehealth: Payer: Self-pay

## 2024-05-27 ENCOUNTER — Encounter: Payer: Self-pay | Admitting: *Deleted

## 2024-05-27 DIAGNOSIS — O09529 Supervision of elderly multigravida, unspecified trimester: Secondary | ICD-10-CM | POA: Insufficient documentation

## 2024-05-28 ENCOUNTER — Other Ambulatory Visit: Payer: Self-pay | Admitting: Student

## 2024-05-28 ENCOUNTER — Other Ambulatory Visit

## 2024-05-28 ENCOUNTER — Ambulatory Visit

## 2024-05-28 ENCOUNTER — Ambulatory Visit: Attending: Student

## 2024-05-28 ENCOUNTER — Ambulatory Visit: Admitting: Maternal & Fetal Medicine

## 2024-05-28 VITALS — BP 126/89

## 2024-05-28 DIAGNOSIS — O09293 Supervision of pregnancy with other poor reproductive or obstetric history, third trimester: Secondary | ICD-10-CM | POA: Insufficient documentation

## 2024-05-28 DIAGNOSIS — O10919 Unspecified pre-existing hypertension complicating pregnancy, unspecified trimester: Secondary | ICD-10-CM | POA: Insufficient documentation

## 2024-05-28 DIAGNOSIS — O09522 Supervision of elderly multigravida, second trimester: Secondary | ICD-10-CM

## 2024-05-28 DIAGNOSIS — N96 Recurrent pregnancy loss: Secondary | ICD-10-CM | POA: Insufficient documentation

## 2024-05-28 DIAGNOSIS — Z3A36 36 weeks gestation of pregnancy: Secondary | ICD-10-CM | POA: Diagnosis present

## 2024-05-28 DIAGNOSIS — O09523 Supervision of elderly multigravida, third trimester: Secondary | ICD-10-CM | POA: Diagnosis not present

## 2024-05-28 DIAGNOSIS — O10013 Pre-existing essential hypertension complicating pregnancy, third trimester: Secondary | ICD-10-CM | POA: Diagnosis not present

## 2024-05-29 ENCOUNTER — Telehealth (HOSPITAL_COMMUNITY): Payer: Self-pay | Admitting: *Deleted

## 2024-05-29 NOTE — Telephone Encounter (Signed)
 Preadmission screen

## 2024-05-30 ENCOUNTER — Telehealth (HOSPITAL_COMMUNITY): Payer: Self-pay | Admitting: *Deleted

## 2024-05-30 ENCOUNTER — Encounter (HOSPITAL_COMMUNITY): Payer: Self-pay | Admitting: *Deleted

## 2024-05-30 NOTE — Telephone Encounter (Signed)
 Preadmission screen

## 2024-05-31 ENCOUNTER — Other Ambulatory Visit: Payer: Self-pay

## 2024-05-31 ENCOUNTER — Inpatient Hospital Stay (HOSPITAL_COMMUNITY)
Admission: AD | Admit: 2024-05-31 | Discharge: 2024-06-04 | DRG: 787 | Disposition: A | Discharge status: Home / Self Care | Attending: Obstetrics and Gynecology | Admitting: Obstetrics and Gynecology

## 2024-05-31 DIAGNOSIS — R519 Headache, unspecified: Secondary | ICD-10-CM

## 2024-05-31 DIAGNOSIS — O99344 Other mental disorders complicating childbirth: Secondary | ICD-10-CM | POA: Diagnosis present

## 2024-05-31 DIAGNOSIS — O10919 Unspecified pre-existing hypertension complicating pregnancy, unspecified trimester: Principal | ICD-10-CM | POA: Diagnosis present

## 2024-05-31 DIAGNOSIS — O9912 Other diseases of the blood and blood-forming organs and certain disorders involving the immune mechanism complicating childbirth: Secondary | ICD-10-CM | POA: Diagnosis present

## 2024-05-31 DIAGNOSIS — F419 Anxiety disorder, unspecified: Secondary | ICD-10-CM | POA: Diagnosis present

## 2024-05-31 DIAGNOSIS — J069 Acute upper respiratory infection, unspecified: Secondary | ICD-10-CM | POA: Diagnosis present

## 2024-05-31 DIAGNOSIS — O10913 Unspecified pre-existing hypertension complicating pregnancy, third trimester: Secondary | ICD-10-CM | POA: Diagnosis not present

## 2024-05-31 DIAGNOSIS — O99214 Obesity complicating childbirth: Secondary | ICD-10-CM | POA: Diagnosis present

## 2024-05-31 DIAGNOSIS — O9952 Diseases of the respiratory system complicating childbirth: Secondary | ICD-10-CM | POA: Diagnosis present

## 2024-05-31 DIAGNOSIS — D62 Acute posthemorrhagic anemia: Secondary | ICD-10-CM | POA: Diagnosis not present

## 2024-05-31 DIAGNOSIS — D696 Thrombocytopenia, unspecified: Secondary | ICD-10-CM | POA: Diagnosis present

## 2024-05-31 DIAGNOSIS — O9962 Diseases of the digestive system complicating childbirth: Secondary | ICD-10-CM | POA: Diagnosis present

## 2024-05-31 DIAGNOSIS — Z3A36 36 weeks gestation of pregnancy: Secondary | ICD-10-CM | POA: Diagnosis not present

## 2024-05-31 DIAGNOSIS — F988 Other specified behavioral and emotional disorders with onset usually occurring in childhood and adolescence: Secondary | ICD-10-CM | POA: Diagnosis present

## 2024-05-31 DIAGNOSIS — K219 Gastro-esophageal reflux disease without esophagitis: Secondary | ICD-10-CM | POA: Diagnosis present

## 2024-05-31 DIAGNOSIS — O163 Unspecified maternal hypertension, third trimester: Secondary | ICD-10-CM | POA: Diagnosis present

## 2024-05-31 DIAGNOSIS — O321XX0 Maternal care for breech presentation, not applicable or unspecified: Secondary | ICD-10-CM | POA: Diagnosis present

## 2024-05-31 DIAGNOSIS — O1092 Unspecified pre-existing hypertension complicating childbirth: Principal | ICD-10-CM | POA: Diagnosis present

## 2024-05-31 DIAGNOSIS — O9081 Anemia of the puerperium: Secondary | ICD-10-CM | POA: Diagnosis not present

## 2024-05-31 DIAGNOSIS — O322XX0 Maternal care for transverse and oblique lie, not applicable or unspecified: Secondary | ICD-10-CM | POA: Diagnosis present

## 2024-05-31 LAB — COMPREHENSIVE METABOLIC PANEL WITH GFR
ALT: 26 U/L (ref 0–44)
AST: 27 U/L (ref 15–41)
Albumin: 3.6 g/dL (ref 3.5–5.0)
Alkaline Phosphatase: 151 U/L — ABNORMAL HIGH (ref 38–126)
Anion gap: 10 (ref 5–15)
BUN: 6 mg/dL (ref 6–20)
CO2: 22 mmol/L (ref 22–32)
Calcium: 9.2 mg/dL (ref 8.9–10.3)
Chloride: 104 mmol/L (ref 98–111)
Creatinine, Ser: 0.59 mg/dL (ref 0.44–1.00)
GFR, Estimated: >60 mL/min
Glucose, Bld: 98 mg/dL (ref 70–99)
Potassium: 3.6 mmol/L (ref 3.5–5.1)
Sodium: 136 mmol/L (ref 135–145)
Total Bilirubin: 0.3 mg/dL (ref 0.0–1.2)
Total Protein: 6.3 g/dL — ABNORMAL LOW (ref 6.5–8.1)

## 2024-05-31 LAB — TYPE AND SCREEN
ABO/RH(D): O POS
Antibody Screen: NEGATIVE

## 2024-05-31 LAB — CBC
HCT: 35.1 % — ABNORMAL LOW (ref 36.0–46.0)
Hemoglobin: 12 g/dL (ref 12.0–15.0)
MCH: 30.5 pg (ref 26.0–34.0)
MCHC: 34.2 g/dL (ref 30.0–36.0)
MCV: 89.1 fL (ref 80.0–100.0)
Platelets: 183 K/uL (ref 150–400)
RBC: 3.94 MIL/uL (ref 3.87–5.11)
RDW: 12.6 % (ref 11.5–15.5)
WBC: 7.6 K/uL (ref 4.0–10.5)
nRBC: 0 % (ref 0.0–0.2)

## 2024-05-31 LAB — PROTEIN / CREATININE RATIO, URINE
Creatinine, Urine: 50 mg/dL
Protein Creatinine Ratio: 0.2 mg/mg — ABNORMAL HIGH
Total Protein, Urine: 10 mg/dL

## 2024-05-31 LAB — RESP PANEL BY RT-PCR (RSV, FLU A&B, COVID)  RVPGX2
Influenza A by PCR: NEGATIVE
Influenza B by PCR: NEGATIVE
Resp Syncytial Virus by PCR: NEGATIVE
SARS Coronavirus 2 by RT PCR: NEGATIVE

## 2024-05-31 MED ORDER — LAMOTRIGINE 150 MG PO TABS
150.0000 mg | ORAL_TABLET | Freq: Every day | ORAL | Status: DC
  Administered 2024-05-31 – 2024-06-03 (×4): 150 mg via ORAL
  Filled 2024-05-31 (×6): qty 1

## 2024-05-31 MED ORDER — PRENATAL MULTIVITAMIN CH
1.0000 | ORAL_TABLET | Freq: Every day | ORAL | Status: DC
  Administered 2024-06-01: 1 via ORAL
  Filled 2024-05-31 (×2): qty 1

## 2024-05-31 MED ORDER — ACETAMINOPHEN-CAFFEINE 500-65 MG PO TABS
2.0000 | ORAL_TABLET | Freq: Once | ORAL | Status: AC
  Administered 2024-05-31: 2 via ORAL
  Filled 2024-05-31: qty 2

## 2024-05-31 MED ORDER — CALCIUM CARBONATE ANTACID 500 MG PO CHEW: 2.0000 | CHEWABLE_TABLET | ORAL | Status: DC | PRN

## 2024-05-31 MED ORDER — DOCUSATE SODIUM 100 MG PO CAPS
100.0000 mg | ORAL_CAPSULE | Freq: Every day | ORAL | Status: DC
  Administered 2024-06-01: 100 mg via ORAL
  Filled 2024-05-31 (×2): qty 1

## 2024-05-31 MED ORDER — NIFEDIPINE ER OSMOTIC RELEASE 60 MG PO TB24
90.0000 mg | ORAL_TABLET | Freq: Every day | ORAL | Status: DC
  Administered 2024-05-31: 90 mg via ORAL
  Filled 2024-05-31: qty 3

## 2024-05-31 MED ORDER — SALINE SPRAY 0.65 % NA SOLN: 1.0000 | NASAL | Status: DC | PRN

## 2024-05-31 MED ORDER — HYDROXYZINE HCL 25 MG PO TABS
25.0000 mg | ORAL_TABLET | Freq: Every evening | ORAL | Status: DC | PRN
  Filled 2024-05-31: qty 1

## 2024-05-31 MED ORDER — FAMOTIDINE 20 MG PO TABS
20.0000 mg | ORAL_TABLET | Freq: Two times a day (BID) | ORAL | Status: DC | PRN
  Administered 2024-06-03 – 2024-06-04 (×2): 20 mg via ORAL
  Filled 2024-05-31 (×2): qty 1

## 2024-05-31 NOTE — MAU Note (Signed)
 Hannah Hill is a 38 y.o. at [redacted]w[redacted]d here in MAU reporting: Upper respiratory congestion, runny nose, cough and body aches. RUQ pain and heart palpitations that began today. Denies LOF or VB. Endorses positive FM appropriate for GA. States she has received the flu vaccine during this pregnancy.   LMP: [id number] Onset of complaint: today Pain score: 3/10 There were no vitals filed for this visit.   FHT: 135  Lab orders placed from triage: none

## 2024-05-31 NOTE — H&P (Signed)
 38 y.o. Hannah Hill @ [redacted]w[redacted]d presents to maternity admission with complaints of congestion, runny nose, cough, body aches.  Was worried that she had the flu. Symptoms started yesterday.  Had a mild frontal headache earlier today that resolved, but recurred while in MAU. She also notes some upper abdominal discomfort that feels different than normal.  She endorses swelling of the hands.  In MAU, her BP has been noted to be elevated above her baseline.  She has had two severe range BPs that have self resolved. Otherwise has good fetal movement and no bleeding.  Pregnancy complicated by: Chronic hypertension: diagnosed at 13 weeks.  Currently on nifedipine Xl 60 mg daily that she takes in the evening (did not take her dose prior to arrival at MAU today) History of preeclampsia with G1 and G2 (at 38 and 37 weeks respectively).  Has been on low dose aspirin this pregnancy Advanced maternal age: low risk NIPT Elevation of LFTs: transiently elevated AST throughout pregnancy, though always < 2x ULN Benign gestational thrombocytopenia:  platelets 145 at 28 weeks, repeat of 155 at 31 weeks Anxiety/ADD: on lamcital   Past Medical History:  Diagnosis Date   Anxiety    Mild preeclampsia 08/07/2012   PONV (postoperative nausea and vomiting)    Pregnancy induced hypertension    SVD (spontaneous vaginal delivery) 03/22/2017    Past Surgical History:  Procedure Laterality Date   BREAST REDUCTION SURGERY     NOSE SURGERY     SHOULDER SURGERY Left    WISDOM TOOTH EXTRACTION      OB History  Gravida Para Term Preterm AB Living  7 3 3  0 3 3  SAB IAB Ectopic Multiple Live Births  3 0 0 0 3    # Outcome Date GA Lbr Len/2nd Weight Sex Type Anes PTL Lv  7 Current           6 Term 03/22/17 [redacted]w[redacted]d / 00:03 3155 g F Vag-Spont EPI  LIV  5 Term 08/08/12 [redacted]w[redacted]d 07:09 / 00:55 3430 g M Vag-Spont EPI  LIV  4 SAB           3 SAB           2 SAB           1 Term      Vag-Spont   LIV    Social History   Socioeconomic  History   Marital status: Married    Spouse name: Not on file   Number of children: Not on file   Years of education: Not on file   Highest education level: Not on file  Occupational History   Not on file  Tobacco Use   Smoking status: Never   Smokeless tobacco: Never  Vaping Use   Vaping status: Never Used  Substance and Sexual Activity   Alcohol use: No   Drug use: No   Sexual activity: Yes    Birth control/protection: None  Other Topics Concern   Not on file  Social History Narrative   Not on file   Social Drivers of Health   Tobacco Use: Low Risk (05/31/2024)   Patient History    Smoking Tobacco Use: Never    Smokeless Tobacco Use: Never    Passive Exposure: Not on file  Financial Resource Strain: Patient Declined (02/14/2024)   Received from [facility]   Overall Financial Resource Strain (CARDIA)    How hard is it for you to pay for the very basics like food, housing, medical  care, and heating?: Patient declined  Food Insecurity: No Food Insecurity (05/31/2024)   Epic    Worried About Programme Researcher, Broadcasting/film/video in the Last Year: Never true    Ran Out of Food in the Last Year: Never true  Transportation Needs: No Transportation Needs (05/31/2024)   Epic    Lack of Transportation (Medical): No    Lack of Transportation (Non-Medical): No  Physical Activity: Unknown (02/14/2024)   Received from [facility]   Exercise Vital Sign    On average, how many days per week do you engage in moderate to strenuous exercise (like a brisk walk)?: Patient declined    On average, how many minutes do you engage in exercise at this level?: 30 min  Stress: Patient Declined (02/14/2024)   Received from [facility] - Occupational Stress Questionnaire    Do you feel stress - tense, restless, nervous, or anxious, or unable to sleep at night because your mind is troubled all the time - these days?: Patient declined  Social Connections: Unknown  (05/31/2024)   Social Connection and Isolation Panel    Frequency of Communication with Friends and Family: More than three times a week    Frequency of Social Gatherings with Friends and Family: Three times a week    Attends Religious Services: Patient declined    Active Member of Clubs or Organizations: Patient declined    Attends Banker Meetings: Patient declined    Marital Status: Married  Catering Manager Violence: Not At Risk (05/31/2024)   Epic    Fear of Current or Ex-Partner: No    Emotionally Abused: No    Physically Abused: No    Sexually Abused: No  Depression (PHQ2-9): Not on file  Alcohol Screen: Not on file  Housing: Unknown (05/31/2024)   Epic    Unable to Pay for Housing in the Last Year: No    Number of Times Moved in the Last Year: Not on file    Homeless in the Last Year: No  Utilities: Not At Risk (05/31/2024)   Epic    Threatened with loss of utilities: No  Health Literacy: Not on file   Amoxicillin, Prednisone , Egg protein-containing drug products, Pantoprazole, and Betadine [povidone iodine]    Prenatal Transfer Tool  Maternal Diabetes: No Genetic Screening: Normal Maternal Ultrasounds/Referrals: Normal Fetal Ultrasounds or other Referrals:  Referred to Materal Fetal Medicine  at 35 weeks Maternal Substance Abuse:  No Significant Maternal Medications:  Meds include: Other:  nifedipine XL and lamictal Significant Maternal Lab Results: Group B Strep negative Vaccines: s/p tdap, flu, rsv (05/14/24)  ABO, Rh:  O+ Antibody: Negative (06/12 0000) Rubella: Immune (06/12 0000) RPR: Nonreactive (06/12 0000)  HBsAg: Negative (06/12 0000)  HIV: Non-reactive (06/12 0000)  GBS:   Negative    Vitals:   05/31/24 2100 05/31/24 2136  BP: (!) 157/91 (!) 138/109  Pulse: 75 (!) 102  Resp:    Temp:    SpO2: 99%      General:  NAD Cardiac: RRR Pulmonary: Lungs clear to auscultation bilaterally, no increased work of breathing Abdomen:  soft,  gravid Ex:  trace ankle edema SVE:  deferred  FHTs:  140s, moderate variability, + accelerations, category 1 Toco:  quiet  Growth US  12/16 with MFM: EFW 6lb 3oz (50%), cephalic  A/P   38 y.o. Hannah Hill [redacted]w[redacted]d presents with chronic hypertension, BPs elevated above baseline in the setting of new onset upper respiratory illness Elevated  BPs: CHTN on nifedipine Xl 60 mg daily.  Was last increased around 32 weeks.  DDx for elevated BPs today include worsening CHTN vs superimposed preeclampsia vs transient elevation of BPs related to current viral illness.  I do not feel this is clearly superimposed preeclampsia at this time. Platelets, creatinine and LFTs are all normal.  Up:c stable at 0.2.  She has not had a sustained elevation in the severe range that has required treatment.  She has had a transient headache, but again this could be related to the viral URI vs sign of preeclampsia.  Given gestational age, I do not want to commit to preterm delivery without clear evidence that this is superimposed severe preeclampsia.  Plan: Admit to AP Serial BP monitoring Will increase daily dose of nifedipine Xl to 90 mg daily (due now) Treat headache with excedrin tension (already starting to lessen in intensity) Repeat labs in AM Viral URI: negative for flu and covid.  Supportive care Anxiety: continue lamictal.  Hydroxyzine prn FWB: continuous fetal monitoring overnight   Hyder Deman GEFFEL Tyniya Kuyper

## 2024-05-31 NOTE — MAU Provider Note (Signed)
 " History     CSN: [id number]  Arrival date and time: 05/31/24 1646   Event Date/Time   First Provider Initiated Contact with Patient 05/31/24 1756      Chief Complaint  Patient presents with   Irregular Heart Beat    That started today   Abdominal Pain    RUQ pain started today   HPI [name] is a 38 y.o. year old [phone] female at [redacted]w[redacted]d weeks gestation who presents to MAU reporting upper respiratory congestion, runny nose and bodyaches; pain rated 3/10.  She also endorses right upper quadrant pain and heart palpitations that started today.  She reports a really bad headache in the middle of the night, but none now.  She denies loss of fluid or vaginal bleeding. She reports positive fetal movement. Her cold symptoms started yesterday.  Her pregnancy is complicated by chronic hypertension; she takes nifedipine  40 mg daily.  Her last dose was last night.  She receives prenatal care with [facility]; her next appointment is 06/04/2024.   OB History     Gravida  7   Para  3   Term  3   Preterm  0   AB  3   Living  3      SAB  3   IAB  0   Ectopic  0   Multiple  0   Live Births  3           Past Medical History:  Diagnosis Date   Anxiety    Mild preeclampsia 08/07/2012   PONV (postoperative nausea and vomiting)    Pregnancy induced hypertension    SVD (spontaneous vaginal delivery) 03/22/2017    Past Surgical History:  Procedure Laterality Date   BREAST REDUCTION SURGERY     NOSE SURGERY     SHOULDER SURGERY Left    WISDOM TOOTH EXTRACTION      Family History  Problem Relation Age of Onset   Hypertension Mother    Diabetes Father    Asthma Father    Hypertension Maternal Aunt    Hypertension Maternal Uncle    Diabetes Paternal Uncle    Hypertension Maternal Grandmother    Diabetes Paternal Grandfather    Other Neg Hx    Cancer Neg Hx     Social History[1]  Allergies: Allergies[2]  Medications Prior to Admission   Medication Sig Dispense Refill Last Dose/Taking   Prenatal Vit-Fe Fumarate-FA (PRENATAL MULTIVITAMIN) TABS Take 1 tablet by mouth daily. 30 tablet 12 Past Week   acetaminophen  (TYLENOL ) 325 MG tablet Take 2 tablets (650 mg total) by mouth every 4 (four) hours as needed (for pain scale < 4). (Patient not taking: Reported on 12/30/2017) 30 tablet 1 Unknown   ALPRAZolam (XANAX) 0.5 MG tablet Take 0.25 mg by mouth 3 (three) times daily. (Patient not taking: No sig reported)   Unknown   buPROPion (WELLBUTRIN XL) 150 MG 24 hr tablet Take 150 mg by mouth daily. (Patient not taking: No sig reported)  0 Unknown   ibuprofen  (ADVIL ,MOTRIN ) 800 MG tablet Take 1 tablet (800 mg total) by mouth every 8 (eight) hours as needed. (Patient not taking: No sig reported) 21 tablet 0 Unknown   levonorgestrel (MIRENA, 52 MG,) 20 MCG/24HR IUD Place once every 5 years (Patient not taking: Reported on 05/28/2024)   Unknown   naproxen sodium (ALEVE) 220 MG tablet Take 220 mg by mouth every 8 (eight) hours.  (Patient not taking: No sig reported)  Unknown   NIFEdipine  (PROCARDIA ) 20 MG capsule Take 40 mg by mouth daily.   Unknown   ondansetron  (ZOFRAN ) 4 MG tablet Take 4 mg by mouth See admin instructions. Take 4 mg by mouth every six to eight hours as needed for nausea (Patient not taking: No sig reported)  0 Unknown   oxyCODONE  (OXY IR/ROXICODONE ) 5 MG immediate release tablet Take 1 tablet (5 mg total) by mouth every 4 (four) hours as needed (pain scale 4-7). (Patient not taking: Reported on 12/30/2017) 15 tablet 0 Unknown   oxyCODONE -acetaminophen  (PERCOCET/ROXICET) 5-325 MG tablet Take 1 tablet by mouth See admin instructions. Take 1 tablet by mouth every 4-6 hours as needed for pain (Patient not taking: No sig reported)  0 Unknown   predniSONE  (DELTASONE ) 20 MG tablet 3 tabs po daily x 3 days, then 2 tabs x 3 days, then 1.5 tabs x 3 days, then 1 tab x 3 days, then 0.5 tabs x 3 days (Patient not taking: Reported on 12/30/2017)  27 tablet 0 Unknown    Review of Systems  Constitutional: Negative.   HENT: Negative.    Eyes: Negative.   Respiratory: Negative.    Cardiovascular: Negative.   Gastrointestinal: Negative.   Endocrine: Negative.   Genitourinary: Negative.   Musculoskeletal: Negative.   Skin: Negative.   Allergic/Immunologic: Negative.   Neurological: Negative.   Hematological: Negative.   Psychiatric/Behavioral: Negative.     Physical Exam   Patient Vitals for the past 24 hrs:  BP Temp Temp src Pulse Resp SpO2 Height Weight  05/31/24 1947 -- -- -- 94 -- 99 % -- --  05/31/24 1945 (!) 151/113 -- -- 94 -- 99 % -- --  05/31/24 1930 (!) 156/91 -- -- 79 -- 99 % -- --  05/31/24 1914 (!) 143/97 -- -- 88 -- -- -- --  05/31/24 1846 (!) 154/106 -- -- 78 -- -- -- --  05/31/24 1825 -- -- -- -- -- 98 % -- --  05/31/24 1820 -- -- -- -- -- 98 % -- --  05/31/24 1815 -- -- -- -- -- 98 % -- --  05/31/24 1814 (!) 155/94 -- -- 84 -- -- -- --  05/31/24 1810 -- -- -- -- -- 99 % -- --  05/31/24 1805 -- -- -- -- -- 99 % -- --  05/31/24 1804 -- -- -- -- -- 99 % -- --  05/31/24 1800 (!) 145/106 -- -- (!) 102 -- 99 % -- --  05/31/24 1755 -- -- -- -- -- 99 % -- --  05/31/24 1750 -- -- -- -- -- 99 % -- --  05/31/24 1745 -- -- -- -- -- 99 % -- --  05/31/24 1740 -- -- -- -- -- 99 % -- --  05/31/24 1735 -- -- -- -- -- 99 % -- --  05/31/24 1730 (!) 164/99 -- -- 91 -- 99 % -- --  05/31/24 1725 -- -- -- -- -- 99 % -- --  05/31/24 1720 -- -- -- -- -- 100 % -- --  05/31/24 1719 (!) 155/100 -- -- 85 -- -- -- --  05/31/24 1718 (!) 155/100 98.4 F (36.9 C) Oral 86 20 100 % 5' 4 (1.626 m) 89.3 kg     Physical Exam Vitals and nursing note reviewed. Exam conducted with a chaperone present.  Constitutional:      Appearance: Normal appearance. She is obese.  HENT:     Head: Normocephalic and atraumatic.  Cardiovascular:  Rate and Rhythm: Normal rate.  Pulmonary:     Effort: Pulmonary effort is normal.   Genitourinary:    Comments: deferred Musculoskeletal:        General: Normal range of motion.  Skin:    General: Skin is warm and dry.  Neurological:     Mental Status: She is alert and oriented to person, place, and time.  Psychiatric:        Mood and Affect: Mood normal.        Behavior: Behavior normal.        Thought Content: Thought content normal.        Judgment: Judgment normal.    REACTIVE NST - FHR: 135 bpm / moderate variability / accels present / decels absent / TOCO: none  MAU Course  Procedures  MDM CCUA PEC labs Excedrin Tension H/A 2 caplets  *Consult with Dr. Abigail @ 1958 - notified of patient's complaints, assessments, lab & U/S results, tx plan admit to L&D for IOL and call on-call MD with recommendations - agrees with plan  TC to Dr. Gretta @ 1959 to inform of recommendation from [facility] practice providers. After review patient's chart Dr. Gretta not in agreement with recommendation and will come see patient.  Results for orders placed or performed during the hospital encounter of 05/31/24 (from the past 24 hours)  CBC     Status: Abnormal   Collection Time: 05/31/24  6:09 PM  Result Value Ref Range   WBC 7.6 4.0 - 10.5 K/uL   RBC 3.94 3.87 - 5.11 MIL/uL   Hemoglobin 12.0 12.0 - 15.0 g/dL   HCT 64.8 (L) 63.9 - 53.9 %   MCV 89.1 80.0 - 100.0 fL   MCH 30.5 26.0 - 34.0 pg   MCHC 34.2 30.0 - 36.0 g/dL   RDW 87.3 88.4 - 84.4 %   Platelets 183 150 - 400 K/uL   nRBC 0.0 0.0 - 0.2 %  Comprehensive metabolic panel with GFR     Status: Abnormal   Collection Time: 05/31/24  6:09 PM  Result Value Ref Range   Sodium 136 135 - 145 mmol/L   Potassium 3.6 3.5 - 5.1 mmol/L   Chloride 104 98 - 111 mmol/L   CO2 22 22 - 32 mmol/L   Glucose, Bld 98 70 - 99 mg/dL   BUN 6 6 - 20 mg/dL   Creatinine, Ser 9.40 0.44 - 1.00 mg/dL   Calcium  9.2 8.9 - 10.3 mg/dL   Total Protein 6.3 (L) 6.5 - 8.1 g/dL   Albumin 3.6 3.5 - 5.0 g/dL   AST 27 15 - 41 U/L   ALT 26 0 - 44 U/L    Alkaline Phosphatase 151 (H) 38 - 126 U/L   Total Bilirubin 0.3 0.0 - 1.2 mg/dL   GFR, Estimated >39 >39 mL/min   Anion gap 10 5 - 15  Protein / creatinine ratio, urine     Status: Abnormal   Collection Time: 05/31/24  6:15 PM  Result Value Ref Range   Creatinine, Urine 50 mg/dL   Total Protein, Urine 10 mg/dL   Protein Creatinine Ratio 0.2 (H) <0.2 mg/mg    Assessment and Plan  1. Upper Respiratory Symptoms 2. Chronic Hypertension affecting Pregnancy 3. Headache in Pregnancy - Care assumed by Dr. Gretta at [address], CNM 05/31/2024, 5:56 PM     [1]  Social History Tobacco Use   Smoking status: Never   Smokeless tobacco: Never  Vaping Use  Vaping status: Never Used  Substance Use Topics   Alcohol use: No   Drug use: No  [2]  Allergies Allergen Reactions   Amoxicillin Hives and Swelling    Facial swelling Has patient had a PCN reaction causing immediate rash, facial/tongue/throat swelling, SOB or lightheadedness with hypotension: Yes Has patient had a PCN reaction causing severe rash involving mucus membranes or skin necrosis: No Has patient had a PCN reaction that required hospitalization: No Has patient had a PCN reaction occurring within the last 10 years: Yes If all of the above answers are NO, then may proceed with Cephalosporin use     Prednisone  Shortness Of Breath, Anxiety and Other (See Comments)    Respiratory Distress   Egg Protein-Containing Drug Products Nausea And Vomiting and Other (See Comments)    GI Upset, also   Pantoprazole Other (See Comments)    Arthralgia (Joint Pain)   Betadine [Povidone Iodine] Hives and Rash   "

## 2024-06-01 LAB — COMPREHENSIVE METABOLIC PANEL WITH GFR
ALT: 24 U/L (ref 0–44)
AST: 24 U/L (ref 15–41)
Albumin: 3.5 g/dL (ref 3.5–5.0)
Alkaline Phosphatase: 142 U/L — ABNORMAL HIGH (ref 38–126)
Anion gap: 12 (ref 5–15)
BUN: 7 mg/dL (ref 6–20)
CO2: 19 mmol/L — ABNORMAL LOW (ref 22–32)
Calcium: 8.7 mg/dL — ABNORMAL LOW (ref 8.9–10.3)
Chloride: 105 mmol/L (ref 98–111)
Creatinine, Ser: 0.66 mg/dL (ref 0.44–1.00)
GFR, Estimated: >60 mL/min
Glucose, Bld: 78 mg/dL (ref 70–99)
Potassium: 3.5 mmol/L (ref 3.5–5.1)
Sodium: 136 mmol/L (ref 135–145)
Total Bilirubin: 0.3 mg/dL (ref 0.0–1.2)
Total Protein: 6 g/dL — ABNORMAL LOW (ref 6.5–8.1)

## 2024-06-01 LAB — CBC
HCT: 33.2 % — ABNORMAL LOW (ref 36.0–46.0)
Hemoglobin: 11.3 g/dL — ABNORMAL LOW (ref 12.0–15.0)
MCH: 30 pg (ref 26.0–34.0)
MCHC: 34 g/dL (ref 30.0–36.0)
MCV: 88.1 fL (ref 80.0–100.0)
Platelets: 169 K/uL (ref 150–400)
RBC: 3.77 MIL/uL — ABNORMAL LOW (ref 3.87–5.11)
RDW: 12.8 % (ref 11.5–15.5)
WBC: 6.9 K/uL (ref 4.0–10.5)
nRBC: 0 % (ref 0.0–0.2)

## 2024-06-01 MED ORDER — CYCLOBENZAPRINE HCL 10 MG PO TABS
10.0000 mg | ORAL_TABLET | Freq: Once | ORAL | Status: AC
  Administered 2024-06-01: 10 mg via ORAL
  Filled 2024-06-01: qty 1

## 2024-06-01 MED ORDER — CYCLOBENZAPRINE HCL 10 MG PO TABS
5.0000 mg | ORAL_TABLET | Freq: Once | ORAL | Status: DC
  Filled 2024-06-01: qty 1

## 2024-06-01 MED ORDER — ONDANSETRON 4 MG PO TBDP: 4.0000 mg | ORAL_TABLET | Freq: Three times a day (TID) | ORAL | Status: DC | PRN

## 2024-06-01 MED ORDER — NIFEDIPINE ER OSMOTIC RELEASE 30 MG PO TB24
60.0000 mg | ORAL_TABLET | Freq: Every day | ORAL | Status: DC
  Administered 2024-06-01 – 2024-06-03 (×2): 60 mg via ORAL
  Filled 2024-06-01 (×2): qty 2
  Filled 2024-06-01: qty 1

## 2024-06-01 MED ORDER — OXYMETAZOLINE HCL 0.05 % NA SOLN
1.0000 | Freq: Two times a day (BID) | NASAL | Status: DC
  Filled 2024-06-01: qty 30

## 2024-06-01 MED ORDER — ACETAMINOPHEN-CAFFEINE 500-65 MG PO TABS
2.0000 | ORAL_TABLET | Freq: Four times a day (QID) | ORAL | Status: DC | PRN
  Administered 2024-06-01 – 2024-06-02 (×2): 2 via ORAL
  Filled 2024-06-01 (×3): qty 2

## 2024-06-01 MED ORDER — ACETAMINOPHEN-CAFFEINE 500-65 MG PO TABS
2.0000 | ORAL_TABLET | Freq: Once | ORAL | Status: AC
  Administered 2024-06-01: 2 via ORAL
  Filled 2024-06-01: qty 2

## 2024-06-01 NOTE — Plan of Care (Signed)

## 2024-06-01 NOTE — Progress Notes (Signed)
 Subjective: Shallen is doing ok today. She reports an intermittent headache that has improved with treatment. Her initial headache that she had on presentation resolved with excedrin yesterday evening. Then she developed another headache this morning, that also resolved with excedrin but came back about an hour later and was an 8/10. This was decreased to a 4/10 by putting ice behind her neck and using a cold washcloth on her face. She had not yet taken the ordered flexeril  for this headache. She denies vision changes, chest pain, shortness of breath, or right upper quadrant pain. Baby moving well. No LOF or VB. Intermittent contractions, most recently overnight, only two today. Reports that her flu symptoms in the form of body aches have significantly improved since admission.   Objective:    06/01/2024    6:11 AM 06/01/2024    4:13 AM 06/01/2024    2:01 AM  Vitals with BMI  Systolic 128 116 869  Diastolic 69 70 68  Pulse 88 78 72   Physical Exam:  General: no acute distress Pulm: normal work of breathing on room air Card: well perfused. Mild non-pitting edema Abd: soft, non-tender MSK: normal ROM Neuro: no focal deficits, oriented x3 Psych: normal mood, normal thought   Assessment/Plan: A/P   38 y.o. [id number] [redacted]w[redacted]d with chronic hypertension, admitted for observation for BPs elevated above baseline in the setting of new onset upper respiratory illness Elevated BPs: CHTN on nifedipine  Xl 60 mg daily (last increased around 32 weeks). Was increased to 90mg  daily on admission yesterday evening with good response. DDx for elevated BPs yesterday included worsening CHTN vs superimposed preeclampsia vs transient elevation of BPs related to current viral illness. Clinical picture was not consistent with superimposed pre-eclampsia on admission. PIH labs normal on admission, repeat this morning also normal.  Up:c stable at 0.2.  She did not have a sustained elevation in the severe range that has  required treatment.  Intermittent headache that has been responsive to treatment. Given gestational age, I do not want to commit to preterm delivery without clear evidence that this is superimposed severe preeclampsia, however, I also do not want to mask worsening cHTN or pre-E by continuing to give more blood pressure medication. Will go back down to prior dose of nifedipine  60mg  daily, which she has been stable on since 32w, starting tonight, and continue to monitor Bps inpatient Monitor for symptoms, especially headache Will start magnesium and move towards delivery if patient develops uncontrolled blood pressures, significant lab abnormalities, or unremitting symptoms. Viral URI: negative for flu and covid.  Supportive care Anxiety: continue lamictal .  Hydroxyzine  prn FWB: continuous fetal monitoring overnight was reassuring. Plan daily NST. Growth US  12/16 with MFM: EFW 6lb 3oz (50%), cephalic  Dispo: Continue inpatient monitoring on [facility] as described above.    LOS: 0 days    Rubie DELENA Husky, MD 06/01/2024, 7:42 AM

## 2024-06-02 ENCOUNTER — Inpatient Hospital Stay (HOSPITAL_COMMUNITY): Admitting: Anesthesiology

## 2024-06-02 ENCOUNTER — Encounter (HOSPITAL_COMMUNITY): Payer: Self-pay | Admitting: Anesthesiology

## 2024-06-02 ENCOUNTER — Encounter (HOSPITAL_COMMUNITY): Admission: AD | Disposition: A | Payer: Self-pay | Source: Home / Self Care | Attending: Obstetrics and Gynecology

## 2024-06-02 ENCOUNTER — Encounter (HOSPITAL_COMMUNITY): Payer: Self-pay | Admitting: Obstetrics and Gynecology

## 2024-06-02 DIAGNOSIS — O26893 Other specified pregnancy related conditions, third trimester: Secondary | ICD-10-CM | POA: Diagnosis present

## 2024-06-02 DIAGNOSIS — O321XX0 Maternal care for breech presentation, not applicable or unspecified: Secondary | ICD-10-CM | POA: Diagnosis present

## 2024-06-02 DIAGNOSIS — O1092 Unspecified pre-existing hypertension complicating childbirth: Secondary | ICD-10-CM | POA: Diagnosis present

## 2024-06-02 DIAGNOSIS — D62 Acute posthemorrhagic anemia: Secondary | ICD-10-CM | POA: Diagnosis not present

## 2024-06-02 DIAGNOSIS — O1002 Pre-existing essential hypertension complicating childbirth: Secondary | ICD-10-CM | POA: Diagnosis not present

## 2024-06-02 DIAGNOSIS — Z3A36 36 weeks gestation of pregnancy: Secondary | ICD-10-CM

## 2024-06-02 DIAGNOSIS — O9081 Anemia of the puerperium: Secondary | ICD-10-CM | POA: Diagnosis not present

## 2024-06-02 DIAGNOSIS — O09523 Supervision of elderly multigravida, third trimester: Secondary | ICD-10-CM | POA: Diagnosis not present

## 2024-06-02 DIAGNOSIS — K219 Gastro-esophageal reflux disease without esophagitis: Secondary | ICD-10-CM | POA: Diagnosis present

## 2024-06-02 DIAGNOSIS — F988 Other specified behavioral and emotional disorders with onset usually occurring in childhood and adolescence: Secondary | ICD-10-CM | POA: Diagnosis present

## 2024-06-02 DIAGNOSIS — F419 Anxiety disorder, unspecified: Secondary | ICD-10-CM | POA: Diagnosis present

## 2024-06-02 DIAGNOSIS — D696 Thrombocytopenia, unspecified: Secondary | ICD-10-CM | POA: Diagnosis present

## 2024-06-02 DIAGNOSIS — J069 Acute upper respiratory infection, unspecified: Secondary | ICD-10-CM | POA: Diagnosis present

## 2024-06-02 DIAGNOSIS — O9912 Other diseases of the blood and blood-forming organs and certain disorders involving the immune mechanism complicating childbirth: Secondary | ICD-10-CM | POA: Diagnosis present

## 2024-06-02 DIAGNOSIS — O322XX0 Maternal care for transverse and oblique lie, not applicable or unspecified: Secondary | ICD-10-CM | POA: Diagnosis present

## 2024-06-02 DIAGNOSIS — O9962 Diseases of the digestive system complicating childbirth: Secondary | ICD-10-CM | POA: Diagnosis present

## 2024-06-02 DIAGNOSIS — O9952 Diseases of the respiratory system complicating childbirth: Secondary | ICD-10-CM | POA: Diagnosis present

## 2024-06-02 DIAGNOSIS — O99344 Other mental disorders complicating childbirth: Secondary | ICD-10-CM | POA: Diagnosis present

## 2024-06-02 DIAGNOSIS — O99214 Obesity complicating childbirth: Secondary | ICD-10-CM | POA: Diagnosis present

## 2024-06-02 DIAGNOSIS — O10919 Unspecified pre-existing hypertension complicating pregnancy, unspecified trimester: Secondary | ICD-10-CM | POA: Diagnosis present

## 2024-06-02 LAB — COMPREHENSIVE METABOLIC PANEL WITH GFR
ALT: 21 U/L (ref 0–44)
AST: 25 U/L (ref 15–41)
Albumin: 3.3 g/dL — ABNORMAL LOW (ref 3.5–5.0)
Alkaline Phosphatase: 146 U/L — ABNORMAL HIGH (ref 38–126)
Anion gap: 12 (ref 5–15)
BUN: 7 mg/dL (ref 6–20)
CO2: 20 mmol/L — ABNORMAL LOW (ref 22–32)
Calcium: 8.9 mg/dL (ref 8.9–10.3)
Chloride: 106 mmol/L (ref 98–111)
Creatinine, Ser: 0.63 mg/dL (ref 0.44–1.00)
GFR, Estimated: >60 mL/min
Glucose, Bld: 66 mg/dL — ABNORMAL LOW (ref 70–99)
Potassium: 4.2 mmol/L (ref 3.5–5.1)
Sodium: 138 mmol/L (ref 135–145)
Total Bilirubin: 0.3 mg/dL (ref 0.0–1.2)
Total Protein: 5.8 g/dL — ABNORMAL LOW (ref 6.5–8.1)

## 2024-06-02 LAB — CBC
HCT: 33.6 % — ABNORMAL LOW (ref 36.0–46.0)
HCT: 34.5 % — ABNORMAL LOW (ref 36.0–46.0)
Hemoglobin: 11.3 g/dL — ABNORMAL LOW (ref 12.0–15.0)
Hemoglobin: 11.6 g/dL — ABNORMAL LOW (ref 12.0–15.0)
MCH: 29.8 pg (ref 26.0–34.0)
MCH: 30.1 pg (ref 26.0–34.0)
MCHC: 33.6 g/dL (ref 30.0–36.0)
MCHC: 33.6 g/dL (ref 30.0–36.0)
MCV: 88.7 fL (ref 80.0–100.0)
MCV: 89.6 fL (ref 80.0–100.0)
Platelets: 166 K/uL (ref 150–400)
Platelets: 174 K/uL (ref 150–400)
RBC: 3.79 MIL/uL — ABNORMAL LOW (ref 3.87–5.11)
RBC: 3.85 MIL/uL — ABNORMAL LOW (ref 3.87–5.11)
RDW: 12.8 % (ref 11.5–15.5)
RDW: 13 % (ref 11.5–15.5)
WBC: 7.8 K/uL (ref 4.0–10.5)
WBC: 7.4 K/uL (ref 4.0–10.5)
nRBC: 0 % (ref 0.0–0.2)
nRBC: 0 % (ref 0.0–0.2)

## 2024-06-02 SURGERY — Surgical Case
Anesthesia: Epidural | Site: Abdomen

## 2024-06-02 MED ORDER — FENTANYL-BUPIVACAINE-NACL 0.5-0.125-0.9 MG/250ML-% EP SOLN: 12.0000 mL/h | EPIDURAL | Status: DC | PRN

## 2024-06-02 MED ORDER — SODIUM CHLORIDE 0.9 % IR SOLN
Status: DC | PRN
  Administered 2024-06-02: 1

## 2024-06-02 MED ORDER — DEXAMETHASONE SOD PHOSPHATE PF 10 MG/ML IJ SOLN
INTRAMUSCULAR | Status: DC | PRN
  Administered 2024-06-02: 10 mg via INTRAVENOUS

## 2024-06-02 MED ORDER — LACTATED RINGERS IV SOLN: INTRAVENOUS | Status: DC

## 2024-06-02 MED ORDER — DIPHENHYDRAMINE HCL 25 MG PO CAPS: 25.0000 mg | ORAL_CAPSULE | Freq: Four times a day (QID) | ORAL | Status: DC | PRN

## 2024-06-02 MED ORDER — PHENYLEPHRINE 80 MCG/ML (10ML) SYRINGE FOR IV PUSH (FOR BLOOD PRESSURE SUPPORT): 80.0000 ug | PREFILLED_SYRINGE | INTRAVENOUS | Status: DC | PRN

## 2024-06-02 MED ORDER — PHENYLEPHRINE 80 MCG/ML (10ML) SYRINGE FOR IV PUSH (FOR BLOOD PRESSURE SUPPORT)
80.0000 ug | PREFILLED_SYRINGE | INTRAVENOUS | Status: DC | PRN
  Administered 2024-06-02 (×2): 80 ug via INTRAVENOUS

## 2024-06-02 MED ORDER — OXYTOCIN-SODIUM CHLORIDE 30-0.9 UT/500ML-% IV SOLN: INTRAVENOUS | Status: DC | PRN

## 2024-06-02 MED ORDER — WITCH HAZEL-GLYCERIN EX PADS: 1.0000 | MEDICATED_PAD | CUTANEOUS | Status: DC | PRN

## 2024-06-02 MED ORDER — CEFAZOLIN SODIUM-DEXTROSE 2-3 GM-%(50ML) IV SOLR
INTRAVENOUS | Status: DC | PRN
  Administered 2024-06-02: 2 g via INTRAVENOUS

## 2024-06-02 MED ORDER — KETOROLAC TROMETHAMINE 30 MG/ML IJ SOLN
INTRAMUSCULAR | Status: AC
  Filled 2024-06-02: qty 1

## 2024-06-02 MED ORDER — SENNOSIDES-DOCUSATE SODIUM 8.6-50 MG PO TABS
2.0000 | ORAL_TABLET | Freq: Every day | ORAL | Status: DC
  Administered 2024-06-03 – 2024-06-04 (×2): 2 via ORAL
  Filled 2024-06-02 (×2): qty 2

## 2024-06-02 MED ORDER — LIDOCAINE-EPINEPHRINE (PF) 2 %-1:200000 IJ SOLN
INTRAMUSCULAR | Status: DC | PRN
  Administered 2024-06-02 (×4): 5 mL via INTRADERMAL

## 2024-06-02 MED ORDER — COCONUT OIL OIL: 1.0000 | TOPICAL_OIL | Status: DC | PRN

## 2024-06-02 MED ORDER — SIMETHICONE 80 MG PO CHEW: 80.0000 mg | CHEWABLE_TABLET | ORAL | Status: DC | PRN

## 2024-06-02 MED ORDER — PHENYLEPHRINE 80 MCG/ML (10ML) SYRINGE FOR IV PUSH (FOR BLOOD PRESSURE SUPPORT)
PREFILLED_SYRINGE | INTRAVENOUS | Status: AC
  Filled 2024-06-02: qty 10

## 2024-06-02 MED ORDER — ACETAMINOPHEN 500 MG PO TABS
1000.0000 mg | ORAL_TABLET | Freq: Four times a day (QID) | ORAL | Status: DC
  Administered 2024-06-03 – 2024-06-04 (×7): 1000 mg via ORAL
  Filled 2024-06-02 (×8): qty 2

## 2024-06-02 MED ORDER — OXYTOCIN-SODIUM CHLORIDE 30-0.9 UT/500ML-% IV SOLN
INTRAVENOUS | Status: DC | PRN
  Administered 2024-06-02: 30 [IU] via INTRAVENOUS

## 2024-06-02 MED ORDER — EPHEDRINE 5 MG/ML INJ: 10.0000 mg | INTRAVENOUS | Status: DC | PRN

## 2024-06-02 MED ORDER — EPHEDRINE 5 MG/ML INJ
INTRAVENOUS | Status: AC
  Filled 2024-06-02: qty 5

## 2024-06-02 MED ORDER — KETOROLAC TROMETHAMINE 30 MG/ML IJ SOLN
30.0000 mg | Freq: Once | INTRAMUSCULAR | Status: AC
  Administered 2024-06-02: 30 mg via INTRAVENOUS

## 2024-06-02 MED ORDER — ONDANSETRON HCL 4 MG/2ML IJ SOLN: 4.0000 mg | Freq: Once | INTRAMUSCULAR | Status: DC | PRN

## 2024-06-02 MED ORDER — ACETAMINOPHEN 325 MG PO TABS: 650.0000 mg | ORAL_TABLET | Freq: Four times a day (QID) | ORAL | Status: DC | PRN

## 2024-06-02 MED ORDER — LACTATED RINGERS IV SOLN: 500.0000 mL | Freq: Once | INTRAVENOUS | Status: DC

## 2024-06-02 MED ORDER — GABAPENTIN 100 MG PO CAPS
100.0000 mg | ORAL_CAPSULE | Freq: Two times a day (BID) | ORAL | Status: DC
  Administered 2024-06-03 – 2024-06-04 (×4): 100 mg via ORAL
  Filled 2024-06-02 (×4): qty 1

## 2024-06-02 MED ORDER — SCOPOLAMINE 1 MG/3DAYS TD PT72
MEDICATED_PATCH | TRANSDERMAL | Status: DC | PRN
  Administered 2024-06-02: 1 via TRANSDERMAL

## 2024-06-02 MED ORDER — TERBUTALINE SULFATE 1 MG/ML IJ SOLN
0.2500 mg | Freq: Once | INTRAMUSCULAR | Status: DC
  Administered 2024-06-02: 0.25 mg via SUBCUTANEOUS

## 2024-06-02 MED ORDER — METOCLOPRAMIDE HCL 5 MG/ML IJ SOLN
INTRAMUSCULAR | Status: DC | PRN
  Administered 2024-06-02: 10 mg via INTRAVENOUS

## 2024-06-02 MED ORDER — EPHEDRINE 5 MG/ML INJ
10.0000 mg | INTRAVENOUS | Status: DC | PRN
  Administered 2024-06-02: 10 mg via INTRAVENOUS

## 2024-06-02 MED ORDER — TERBUTALINE SULFATE 1 MG/ML IJ SOLN
INTRAMUSCULAR | Status: AC
  Filled 2024-06-02: qty 1

## 2024-06-02 MED ORDER — OXYTOCIN-SODIUM CHLORIDE 30-0.9 UT/500ML-% IV SOLN
2.5000 [IU]/h | INTRAVENOUS | Status: AC
  Administered 2024-06-03: 2.5 [IU]/h via INTRAVENOUS
  Filled 2024-06-02: qty 500

## 2024-06-02 MED ORDER — PRENATAL MULTIVITAMIN CH
1.0000 | ORAL_TABLET | Freq: Every day | ORAL | Status: DC
  Administered 2024-06-03 – 2024-06-04 (×2): 1 via ORAL
  Filled 2024-06-02 (×2): qty 1

## 2024-06-02 MED ORDER — IBUPROFEN 600 MG PO TABS
600.0000 mg | ORAL_TABLET | Freq: Four times a day (QID) | ORAL | Status: DC
  Administered 2024-06-04 (×3): 600 mg via ORAL
  Filled 2024-06-02 (×3): qty 1

## 2024-06-02 MED ORDER — OXYCODONE HCL 5 MG PO TABS
5.0000 mg | ORAL_TABLET | ORAL | Status: DC | PRN
  Administered 2024-06-03 – 2024-06-04 (×4): 5 mg via ORAL
  Filled 2024-06-02 (×4): qty 1

## 2024-06-02 MED ORDER — STERILE WATER FOR IRRIGATION IR SOLN
Status: DC | PRN
  Administered 2024-06-02: 1000 mL

## 2024-06-02 MED ORDER — ONDANSETRON HCL 4 MG/2ML IJ SOLN
INTRAMUSCULAR | Status: DC | PRN
  Administered 2024-06-02: 4 mg via INTRAVENOUS

## 2024-06-02 MED ORDER — DROPERIDOL 2.5 MG/ML IJ SOLN: 0.6250 mg | Freq: Once | INTRAMUSCULAR | Status: DC | PRN

## 2024-06-02 MED ORDER — SIMETHICONE 80 MG PO CHEW
80.0000 mg | CHEWABLE_TABLET | Freq: Three times a day (TID) | ORAL | Status: DC
  Administered 2024-06-03 – 2024-06-04 (×6): 80 mg via ORAL
  Filled 2024-06-02 (×6): qty 1

## 2024-06-02 MED ORDER — LACTATED RINGERS IV SOLN: INTRAVENOUS | Status: DC | PRN

## 2024-06-02 MED ORDER — DIPHENHYDRAMINE HCL 50 MG/ML IJ SOLN: 12.5000 mg | INTRAMUSCULAR | Status: DC | PRN

## 2024-06-02 MED ORDER — ZOLPIDEM TARTRATE 5 MG PO TABS: 5.0000 mg | ORAL_TABLET | Freq: Every evening | ORAL | Status: DC | PRN

## 2024-06-02 MED ORDER — MENTHOL 3 MG MT LOZG: 1.0000 | LOZENGE | OROMUCOSAL | Status: DC | PRN

## 2024-06-02 MED ORDER — KETOROLAC TROMETHAMINE 30 MG/ML IJ SOLN
30.0000 mg | Freq: Four times a day (QID) | INTRAMUSCULAR | Status: AC
  Administered 2024-06-03 (×4): 30 mg via INTRAVENOUS
  Filled 2024-06-02 (×4): qty 1

## 2024-06-02 MED ORDER — DIBUCAINE (PERIANAL) 1 % EX OINT: 1.0000 | TOPICAL_OINTMENT | CUTANEOUS | Status: DC | PRN

## 2024-06-02 SURGICAL SUPPLY — 32 items
BENZOIN TINCTURE PRP APPL 2/3 (GAUZE/BANDAGES/DRESSINGS) ×1 IMPLANT
CHLORAPREP W/TINT 26 (MISCELLANEOUS) ×2 IMPLANT
CLAMP UMBILICAL CORD (MISCELLANEOUS) ×1 IMPLANT
CLOTH BEACON ORANGE TIMEOUT ST (SAFETY) ×1 IMPLANT
DERMABOND ADVANCED .7 DNX12 (GAUZE/BANDAGES/DRESSINGS) IMPLANT
DRSG OPSITE POSTOP 4X10 (GAUZE/BANDAGES/DRESSINGS) ×1 IMPLANT
ELECTRODE REM PT RTRN 9FT ADLT (ELECTROSURGICAL) ×1 IMPLANT
EXTRACTOR VACUUM KIWI (MISCELLANEOUS) IMPLANT
GLOVE BIOGEL M STER SZ 6 (GLOVE) ×1 IMPLANT
GLOVE BIOGEL PI IND STRL 6.5 (GLOVE) ×1 IMPLANT
GLOVE BIOGEL PI IND STRL 7.0 (GLOVE) ×1 IMPLANT
GOWN STRL REUS W/TWL LRG LVL3 (GOWN DISPOSABLE) ×2 IMPLANT
KIT ABG SYR 3ML LUER SLIP (SYRINGE) IMPLANT
MAT PREVALON FULL STRYKER (MISCELLANEOUS) IMPLANT
NDL HYPO 25X5/8 SAFETYGLIDE (NEEDLE) IMPLANT
NEEDLE HYPO 25X5/8 SAFETYGLIDE (NEEDLE) IMPLANT
NS IRRIG 1000ML POUR BTL (IV SOLUTION) ×1 IMPLANT
PACK C SECTION WH (CUSTOM PROCEDURE TRAY) ×1 IMPLANT
PAD OB MATERNITY 4.3X12.25 (PERSONAL CARE ITEMS) ×1 IMPLANT
RTRCTR C-SECT PINK 25CM LRG (MISCELLANEOUS) ×1 IMPLANT
STRIP CLOSURE SKIN 1/2X4 (GAUZE/BANDAGES/DRESSINGS) ×1 IMPLANT
SUT MNCRL 0 VIOLET CTX 36 (SUTURE) ×2 IMPLANT
SUT PDS AB 0 CTX 60 (SUTURE) IMPLANT
SUT PLAIN 0 NONE (SUTURE) IMPLANT
SUT PLAIN ABS 2-0 CT1 27XMFL (SUTURE) ×1 IMPLANT
SUT VIC AB 0 CTX36XBRD ANBCTRL (SUTURE) ×2 IMPLANT
SUT VIC AB 2-0 CT1 TAPERPNT 27 (SUTURE) ×1 IMPLANT
SUT VIC AB 4-0 KS 27 (SUTURE) ×1 IMPLANT
SUTURE PLAIN GUT 2.0 ETHICON (SUTURE) IMPLANT
TOWEL OR 17X24 6PK STRL BLUE (TOWEL DISPOSABLE) ×1 IMPLANT
TRAY FOLEY W/BAG SLVR 14FR LF (SET/KITS/TRAYS/PACK) ×1 IMPLANT
WATER STERILE IRR 1000ML POUR (IV SOLUTION) ×1 IMPLANT

## 2024-06-02 NOTE — Anesthesia Postprocedure Evaluation (Signed)
"   Anesthesia Post Note  Patient: Hannah Hill  Procedure(s) Performed: External Cephalic version     Patient location during evaluation: PACU Anesthesia Type: Epidural Level of consciousness: oriented and awake and alert Pain management: pain level controlled Vital Signs Assessment: post-procedure vital signs reviewed and stable Respiratory status: spontaneous breathing, respiratory function stable, nonlabored ventilation and patient connected to face mask oxygen Cardiovascular status: blood pressure returned to baseline and stable Postop Assessment: no headache, no backache and no apparent nausea or vomiting Anesthetic complications: no   No notable events documented.  Last Vitals:  Vitals:   06/02/24 1157 06/02/24 1730  BP: 118/63 135/62  Pulse: 66 68  Resp: 18 17  Temp: 36.5 C 36.6 C  SpO2: 98% 99%    Last Pain:  Vitals:   06/02/24 1730  TempSrc: Oral  PainSc:    Pain Goal:                   Mallorie Norrod A.      "

## 2024-06-02 NOTE — Plan of Care (Signed)

## 2024-06-02 NOTE — Progress Notes (Signed)
 Brief Update Note  Given options for delivery timing, patient desires today, which is now possible with current L&D/ OB-OR staffing. Given breech presentation, I thoroughly discussed options for external cephalic version followed by IOL versus scheduled C-section.  I discussed with the patient and her husband Prentice the potential associated risks of ECV including fetal intolerance (higher chance given late preterm), placental abruption, and possible accidental rupture of membranes with cord prolapse, and that any of these could require moving forward with an urgent C-section. Additionally discussed that failure of ECV would require moving forward with C-section delivery for breech. With shared decision making, elects to proceed with ECV today. Will plan terbutaline  prior to ECV. Discussed case with anesthesiologist, will leave determination of regional anesthesia prior to ECV to his discussion with the patient. Patient has been NPO since 11am, will move forward with procedure at 19:15 per OB OR charge RN. Orders placed.

## 2024-06-02 NOTE — Anesthesia Preprocedure Evaluation (Addendum)
 "                                  Anesthesia Evaluation  Patient identified by MRN, date of birth, ID band Patient awake    Reviewed: Allergy & Precautions, NPO status , Patient's Chart, lab work & pertinent test results  History of Anesthesia Complications (+) PONV and history of anesthetic complications  Airway Mallampati: II  TM Distance: >3 FB     Dental no notable dental hx. (+) Teeth Intact, Dental Advisory Given, Caps   Pulmonary neg pulmonary ROS   Pulmonary exam normal breath sounds clear to auscultation       Cardiovascular hypertension, Pt. on medications Normal cardiovascular exam Rhythm:Regular Rate:Normal  EKG 05/31/25 NSR, poor R wave progression   Neuro/Psych   Anxiety     ADDnegative neurological ROS     GI/Hepatic Neg liver ROS,GERD  Medicated,,  Endo/Other  Obesity  Renal/GU negative Renal ROSLab Results      Component                Value               Date                      NA                       138                 06/02/2024                CL                       106                 06/02/2024                K                        4.2                 06/02/2024                CO2                      20 (L)              06/02/2024                BUN                      7                   06/02/2024                CREATININE               0.63                06/02/2024                GFRNONAA                 >60                 06/02/2024  CALCIUM                   8.9                 06/02/2024                ALBUMIN                  3.3 (L)             06/02/2024                GLUCOSE                  66 (L)              06/02/2024             negative genitourinary   Musculoskeletal negative musculoskeletal ROS (+)    Abdominal  (+) + obese  Peds  Hematology  (+) Blood dyscrasia, anemia Lab Results      Component                Value               Date                      WBC                       7.8                 06/02/2024                HGB                      11.3 (L)            06/02/2024                HCT                      33.6 (L)            06/02/2024                MCV                      88.7                06/02/2024                PLT                      166                 06/02/2024              Anesthesia Other Findings   Reproductive/Obstetrics (+) Pregnancy cHTN Breech presentation 36 4/7 weeks AMA                              Anesthesia Physical Anesthesia Plan  ASA: 2  Anesthesia Plan: Epidural   Post-op Pain Management: Minimal or no pain anticipated   Induction: Intravenous  PONV Risk Score and Plan: 4 or greater and Treatment may vary due to age or medical condition  Airway Management Planned: Natural Airway  Additional Equipment: None and Fetal Monitoring  Intra-op Plan:  Post-operative Plan:   Informed Consent: I have reviewed the patients History and Physical, chart, labs and discussed the procedure including the risks, benefits and alternatives for the proposed anesthesia with the patient or authorized representative who has indicated his/her understanding and acceptance.     Dental advisory given  Plan Discussed with: CRNA and Anesthesiologist  Anesthesia Plan Comments:          Anesthesia Quick Evaluation  "

## 2024-06-02 NOTE — Anesthesia Postprocedure Evaluation (Signed)
"   Anesthesia Post Note  Patient: Hannah Hill  Procedure(s) Performed: CESAREAN DELIVERY (Abdomen)     Patient location during evaluation: PACU Anesthesia Type: Epidural Level of consciousness: oriented and awake and alert Pain management: pain level controlled Vital Signs Assessment: post-procedure vital signs reviewed and stable Respiratory status: spontaneous breathing, respiratory function stable and nonlabored ventilation Cardiovascular status: blood pressure returned to baseline and stable Postop Assessment: no headache, no backache, no apparent nausea or vomiting, epidural receding and patient able to bend at knees Anesthetic complications: no   No notable events documented.  Last Vitals:  Vitals:   06/02/24 2115 06/02/24 2130  BP: 131/66 131/75  Pulse: 69 65  Resp: (!) 23 (!) 23  Temp:    SpO2: 100%     Last Pain:  Vitals:   06/02/24 2134  TempSrc:   PainSc: 10-Worst pain ever   Pain Goal:    LLE Motor Response: Purposeful movement (06/02/24 2130) LLE Sensation: Decreased (06/02/24 2130) RLE Motor Response: Purposeful movement (06/02/24 2130) RLE Sensation: Decreased (06/02/24 2130)     Epidural/Spinal Function Cutaneous sensation: Able to Wiggle Toes (06/02/24 2130), Patient able to flex knees: No (06/02/24 2130), Patient able to lift hips off bed: No (06/02/24 2130), Back pain beyond tenderness at insertion site: No (06/02/24 2130), Progressively worsening motor and/or sensory loss: No (06/02/24 2130), Bowel and/or bladder incontinence post epidural: No (06/02/24 2130)  Electa Sterry A.      "

## 2024-06-02 NOTE — Transfer of Care (Signed)
 Immediate Anesthesia Transfer of Care Note  Patient: Hannah Hill  Procedure(s) Performed: CESAREAN DELIVERY (Abdomen)  Patient Location: PACU  Anesthesia Type:Epidural  Level of Consciousness: awake, alert , oriented, and patient cooperative  Airway & Oxygen Therapy: Patient Spontanous Breathing  Post-op Assessment: Report given to RN and Post -op Vital signs reviewed and stable  Post vital signs: Reviewed and stable  Last Vitals:  Vitals Value Taken Time  BP    Temp    Pulse    Resp    SpO2      Last Pain:  Vitals:   06/02/24 1730  TempSrc: Oral  PainSc:          Complications: No notable events documented.

## 2024-06-02 NOTE — Anesthesia Procedure Notes (Signed)
 Epidural Patient location during procedure: OB Start time: 06/02/2024 7:20 PM End time: 06/02/2024 7:40 PM  Staffing Anesthesiologist: Jerrye Sharper, MD Performed: anesthesiologist   Preanesthetic Checklist Completed: patient identified, IV checked, site marked, risks and benefits discussed, surgical consent, monitors and equipment checked, pre-op evaluation and timeout performed  Epidural Patient position: sitting Prep: DuraPrep and site prepped and draped Patient monitoring: continuous pulse ox and blood pressure Approach: midline Location: L3-L4 Injection technique: LOR air  Needle:  Needle type: Tuohy  Needle gauge: 17 G Needle length: 9 cm and 9 Needle insertion depth: 5 cm Catheter type: closed end flexible Catheter size: 19 Gauge Catheter at skin depth: 10 cm Test dose: negative and 2% lidocaine  with Epi 1:200 K  Assessment Events: blood not aspirated, no cerebrospinal fluid, injection not painful, no injection resistance, no paresthesia and negative IV test  Additional Notes Patient identified. Risks and benefits discussed including failed block, incomplete  Pain control, post dural puncture headache, nerve damage, paralysis, blood pressure Changes, nausea, vomiting, reactions to medications-both toxic and allergic and post Partum back pain. All questions were answered. Patient expressed understanding and wished to proceed. Sterile technique was used throughout procedure. Epidural site was Dressed with sterile barrier dressing. No paresthesias, signs of intravascular injection Or signs of intrathecal spread were encountered.  Patient was more comfortable after the epidural was dosed. Please see RN's note for documentation of vital signs and FHR which are stable. Reason for block:at surgeon's request, procedure for pain and surgical anesthesia

## 2024-06-02 NOTE — Op Note (Addendum)
 CESAREAN SECTION Procedure Note  Patient: Hannah Hill DOB May 29, 1986  Preoperative Diagnosis: fetal bradycardia, fetal malpresentation, IUP at [redacted]w[redacted]d, worsening chronic hypertension Postoperative Diagnosis: same, delivered  Procedure: primary low transverse C-section     Surgeon: Rubie Husky, MD  Assistant surgeon: Barkley Angles, MD  A skilled assistant was required for this case due to its complexity.  Anesthesia: Spinal anesthesia   Findings: Normal appearing uterus, fallopian tubes bilaterally, and ovaries bilaterally.  Viable female infant in transverse presentation delivered at 19:44 with weight 6lb8.4oz. Apgars 7 and 9. Cord gases pending at the time of note writing, see addendum for values.  Estimated Blood Loss:  805cc         Specimens: Placenta to pathology         Complications:  None         Disposition: PACU - hemodynamically stable.         Condition: stable    Description of Procedure: Epidural was placed in preparation for external cephalic version. Patient had severe hypotension in response to epidural and was given ephedrine  and epinephrine . During this time IV fluid bolus was also being given and patient was repositioned in hopes of improving fetal heart rate, which was intermittently able to be assessed for approximately four minutes, ranging in the 50s to 70s. Given prolonged fetal bradycardia, I called a STAT C-section.  The patient was taken to the operating room where she was quickly moved over to the OR bed. The patient was placed in the dorsal supine position.  Fetal heart tones were confirmed but still low. Ancef  2g were given for infection prophylaxis. The patient was subsequently splashed with betadine and quickly draped. Epidural anesthesia was tested and found to be adequate  A low transverse skin incision was made with a scalpel and carried down to the level of the fascia sharply.  The fascia was incised and the fascia was dissected off the  rectus muscle bluntly using a stretching motion. The rectus muscles then were separated in the midline.  The peritoneum was found free of adherent bowel and the peritoneal cavity was entered bluntly.  The uterus was identified and the alexis retractor was placed intraperitoneal.    A low transverse hysterotomy was then made with a scalpel.  The infant was found in transverse head maternal right presentation was delivered atraumatically and without difficulty with standard breech maneuvers. No nuchal. Baby had moderate tone and weak cry, so the cord was clamped and cut immediately and the infant was handed off to the NICU team. A cord segment was collected in order to collect cord blood and cord gases. The placenta was delivered with gentle traction on umbilical cord and manual massage of the uterine fundus.  The uterus was cleared of all clot and debris.  The hysterotomy was then closed with 0 monocryl in a running locked fashion. A second layer was placed using 0 monocryl in an imbricating running fashion. The hysterotomy was found to be hemostatic after light Bovie use on serosal edges. The Alexis retractor was removed. The fascia was closed with a 0 Vicryl suture in a continuous running fashion.  The subcutaneous tissue was irrigated and rendered hemostatic with cautery.  The subcutaneous layer was subsequently closed with 2-0 plain gut in an interrupted fashion.  The skin was closed with 4-0 vicryl  in a running subcuticular fashion.  Sponge, lap and needle counts were correct. Dermabond and a Honeycomb dressing were placed on the incision.  The patient and baby  were both doing well when I left the OR. I was able to debrief with the patient and her husband Prentice and go over risks of the C-section which we did not have time to discuss prior to the surgery.   Rubie Husky, MD 06/02/2024, 20:52   Addendum for cord gases:

## 2024-06-02 NOTE — Anesthesia Preprocedure Evaluation (Signed)
 "                                  Anesthesia Evaluation  Patient identified by MRN, date of birth, ID band Patient awake    Reviewed: Allergy & Precautions, NPO status , Patient's Chart, lab work & pertinent test results  History of Anesthesia Complications (+) PONV and history of anesthetic complications  Airway Mallampati: II  TM Distance: >3 FB     Dental no notable dental hx. (+) Teeth Intact, Dental Advisory Given, Caps   Pulmonary neg pulmonary ROS   Pulmonary exam normal breath sounds clear to auscultation       Cardiovascular hypertension, Pt. on medications Normal cardiovascular exam Rhythm:Regular Rate:Normal  EKG 05/31/25 NSR, poor R wave progression   Neuro/Psych   Anxiety     ADDnegative neurological ROS     GI/Hepatic Neg liver ROS,GERD  Medicated,,  Endo/Other  Obesity  Renal/GU negative Renal ROSLab Results      Component                Value               Date                      NA                       138                 06/02/2024                CL                       106                 06/02/2024                K                        4.2                 06/02/2024                CO2                      20 (L)              06/02/2024                BUN                      7                   06/02/2024                CREATININE               0.63                06/02/2024                GFRNONAA                 >60                 06/02/2024  CALCIUM                   8.9                 06/02/2024                ALBUMIN                  3.3 (L)             06/02/2024                GLUCOSE                  66 (L)              06/02/2024             negative genitourinary   Musculoskeletal negative musculoskeletal ROS (+)    Abdominal  (+) + obese  Peds  Hematology  (+) Blood dyscrasia, anemia Lab Results      Component                Value               Date                      WBC                       7.8                 06/02/2024                HGB                      11.3 (L)            06/02/2024                HCT                      33.6 (L)            06/02/2024                MCV                      88.7                06/02/2024                PLT                      166                 06/02/2024              Anesthesia Other Findings   Reproductive/Obstetrics (+) Pregnancy cHTN Breech presentation 36 4/7 weeks AMA                              Anesthesia Physical Anesthesia Plan  ASA: 2  Anesthesia Plan: Epidural   Post-op Pain Management: Minimal or no pain anticipated   Induction: Intravenous  PONV Risk Score and Plan: 4 or greater and Treatment may vary due to age or medical condition  Airway Management Planned: Natural Airway  Additional Equipment: None and Fetal Monitoring  Intra-op Plan:  Post-operative Plan:   Informed Consent: I have reviewed the patients History and Physical, chart, labs and discussed the procedure including the risks, benefits and alternatives for the proposed anesthesia with the patient or authorized representative who has indicated his/her understanding and acceptance.     Dental advisory given  Plan Discussed with: CRNA and Anesthesiologist  Anesthesia Plan Comments:          Anesthesia Quick Evaluation  "

## 2024-06-02 NOTE — Lactation Note (Addendum)
 This note was copied from a baby's chart. Lactation Consultation Note  Patient Name: Hannah Hill Date: 06/02/2024 Age:40 hours Reason for consult: Initial assessment;Late-preterm 34-36.6wks. MOB had breast reduction 7 years ago. See MOB: MR-C/S delivery, AMA, CHTN and infant breech.  P4, LPTI, MOB latch infant on her left breast using pillow support and football hold position, infant briefly latched for 3 minutes and then became tired. Afterwards infant consumed 14 mls of 22 kcal formula using a slow flow bottle nipple. LC set MOB up with DEBP and MOB fitted with 21 mm breast flange. MOB was pumping when LC left the room. MOB will follow LPTI feeding guidelines. LC discussed the importance of maternal rest, meals and hydration. MOB was made aware of O/P services, breastfeeding support groups, community resources, and our phone # for post-discharge questions.    MOB knows that her EBM is safe for 4 hours at room temperature whereas formula RTF is limited 1 hour once open.  Current feeding plan: Day 1-LPTI 1- MOB will breastfeed every 3 hours and limit breast and formula feeding to 30 minutes or less. When latching infant first at the breast will limit breast (chest ) feeding to 10 minutes or less. 2- MOB will offer any pumped EBM first and then 22 kcal formula. 3- MOB will continue to pump every 3 hours for 15 minutes on initial setting.  Maternal Data Has patient been taught Hand Expression?: Yes Does the patient have breastfeeding experience prior to this delivery?: Yes How long did the patient breastfeed?: Per MOB, she breastfeed her 1st child for 6 months, 2nd and 3rd one year each  Feeding Mother's Current Feeding Choice: Breast Milk and Formula  LATCH Score Latch: Grasps breast easily, tongue down, lips flanged, rhythmical sucking.  Audible Swallowing: A few with stimulation  Type of Nipple: Everted at rest and after stimulation  Comfort (Breast/Nipple): Soft /  non-tender  Hold (Positioning): Assistance needed to correctly position infant at breast and maintain latch.  LATCH Score: 8   Lactation Tools Discussed/Used Tools: Pump;Flanges Flange Size: 21 Breast pump type: Double-Electric Breast Pump Pump Education: Setup, frequency, and cleaning;Milk Storage Reason for Pumping: Infant is LPTI Pumping frequency: MOB will continue to pump every 3 hours for 15 minutes on inital setting.  Interventions Interventions: Breast feeding basics reviewed;Assisted with latch;Skin to skin;Support pillows;Position options;Expressed milk;Adjust position;DEBP;Education;Pace feeding;LC Services brochure;Guidelines for Milk Supply and Pumping Schedule Handout;CDC milk storage guidelines;CDC Guidelines for Breast Pump Cleaning;LPT handout/interventions  Discharge Pump: DEBP;Personal  Consult Status Consult Status: Follow-up Date: 06/03/24 Follow-up type: In-patient    Hannah Hill 06/02/2024, 11:44 PM

## 2024-06-02 NOTE — Progress Notes (Addendum)
 Subjective: Hannah Hill reports that she vomited three times overnight, which is not typical for her. She reports that she is feeling better from a potential viral infection standpoint, and has not had body aches since the night of admission. She reports that her headache is still coming and going, but feels more like pressure in the back of her skull; a 3 out of 10. She reports that she has never had a migraine and typically does not have issues with headaches. She declined to take medication for her headache overnight because she thought that it was treatment for a migraine and she did not believe that she had a migraine. She denies vision changes, chest pain, shortness of breath, and RUQ pain. She reports overall that she doesn't feel good, but is having a difficult time explaining what is wrong. She reports that she does not feel anxious overnight and was sleeping when blood pressures were taken. She reports good fetal movement.  Objective:    06/02/2024    4:31 AM 06/02/2024   12:30 AM 06/01/2024   10:00 PM  Vitals with BMI  Systolic 134 132 854  Diastolic 85 101 84  Pulse 68 75 69   Physical Exam:  General: no acute distress Pulm: normal work of breathing on room air Card: well perfused. Mild non-pitting edema Abd: soft, non-tender MSK: normal ROM Neuro: no focal deficits, oriented x3 Psych: normal mood, normal thought   Assessment/Plan: A/P   38 y.o. [id number] [redacted]w[redacted]d with chronic hypertension, HD#3, admitted for observation for BPs elevated above baseline in the setting of new onset upper respiratory illness Elevated BPs: CHTN on nifedipine  60 mg daily (last increased around 32 weeks). Was increased to 90mg  daily for one dose on 12/19 and then decreased yesterday evening back to her stable dose in order to avoid masking worsening cHTN vs. superimposed pre-E. DDx for elevated BPs on presentation included worsening CHTN vs superimposed preeclampsia vs transient elevation of BPs related to  viral illness, however viral illness is resolved symptomatically. BP max in the last 24 hours was 155/82.  She has not had a sustained elevation in the severe range that has required treatment during her hospitalization. PIH labs normal on admission, repeats normal x2. Up:c stable at 0.2. Intermittent headache that has been responsive to treatment, otherwise asx. Given gestational age, and mixed clinical picture, I do not want to commit to preterm delivery without clear evidence that this is superimposed severe preeclampsia or worsening cHTN. Continue inpatient monitoring, with focus on blood pressures and symptoms Will discuss case with on-call MFM Will start magnesium and move towards delivery if patient develops uncontrolled blood pressures, significant lab abnormalities, or unremitting symptoms. Viral URI: negative for flu and covid. Symptoms resolved Anxiety: continue lamictal .  Hydroxyzine  prn FWB: Daily NST. Growth US  12/16 with MFM: EFW 6lb 3oz (50%), cephalic  Dispo: Continue inpatient monitoring as above.   Hannah DELENA Husky, MD 06/02/2024, 6:40 AM  Addendum: Discussed case with on-call MFM, Dr. Kizzie. Given increasing Bps in the setting of cHTN and intermittent symptoms, reasonable to deliver between [redacted]w[redacted]d (today) and [redacted]w[redacted]d for worsening cHTN. L&D is short staffed today and given patient is stable, we will hold off on delivery for now, unless patient develops severe features. Fetal presentation is breech by bedside ultrasound. I briefly discussed ECV vs. Primary C-section with the patient, but will defer counseling regarding these options to the delivering provider tomorrow or Tuesday. Patient plans to perform spinning babies position changes to encourage fetus to move  back to vertex presentation.  PM fetal monitoring yesterday was reactive and reassuring.

## 2024-06-02 NOTE — Anesthesia Preprocedure Evaluation (Signed)
 "                                  Anesthesia Evaluation  Patient identified by MRN, date of birth, ID band Patient awake    Reviewed: Allergy & Precautions, NPO status , Patient's Chart, lab work & pertinent test results  History of Anesthesia Complications (+) PONV and history of anesthetic complications  Airway Mallampati: II  TM Distance: >3 FB     Dental no notable dental hx. (+) Teeth Intact, Dental Advisory Given, Caps   Pulmonary neg pulmonary ROS   Pulmonary exam normal breath sounds clear to auscultation       Cardiovascular hypertension, Pt. on medications Normal cardiovascular exam Rhythm:Regular Rate:Normal  EKG 05/31/25 NSR, poor R wave progression   Neuro/Psych   Anxiety     ADDnegative neurological ROS     GI/Hepatic Neg liver ROS,GERD  Medicated,,  Endo/Other  Obesity  Renal/GU negative Renal ROSLab Results      Component                Value               Date                      NA                       138                 06/02/2024                CL                       106                 06/02/2024                K                        4.2                 06/02/2024                CO2                      20 (L)              06/02/2024                BUN                      7                   06/02/2024                CREATININE               0.63                06/02/2024                GFRNONAA                 >60                 06/02/2024  CALCIUM                   8.9                 06/02/2024                ALBUMIN                  3.3 (L)             06/02/2024                GLUCOSE                  66 (L)              06/02/2024             negative genitourinary   Musculoskeletal negative musculoskeletal ROS (+)    Abdominal  (+) + obese  Peds  Hematology  (+) Blood dyscrasia, anemia Lab Results      Component                Value               Date                      WBC                       7.8                 06/02/2024                HGB                      11.3 (L)            06/02/2024                HCT                      33.6 (L)            06/02/2024                MCV                      88.7                06/02/2024                PLT                      166                 06/02/2024              Anesthesia Other Findings   Reproductive/Obstetrics (+) Pregnancy cHTN Breech presentation 36 4/7 weeks AMA                              Anesthesia Physical Anesthesia Plan  ASA: 2 and emergent  Anesthesia Plan: Epidural   Post-op Pain Management: Minimal or no pain anticipated   Induction: Intravenous  PONV Risk Score and Plan: 4 or greater and Treatment may vary due to age or medical condition  Airway Management Planned: Natural Airway  Additional Equipment: None and Fetal Monitoring  Intra-op Plan:   Post-operative Plan:   Informed Consent: I have reviewed the patients History and Physical, chart, labs and discussed the procedure including the risks, benefits and alternatives for the proposed anesthesia with the patient or authorized representative who has indicated his/her understanding and acceptance.     Dental advisory given  Plan Discussed with: CRNA and Anesthesiologist  Anesthesia Plan Comments: (No narcotics for C/Section)         Anesthesia Quick Evaluation  "

## 2024-06-03 ENCOUNTER — Encounter (HOSPITAL_COMMUNITY): Payer: Self-pay | Admitting: Obstetrics and Gynecology

## 2024-06-03 LAB — CBC
HCT: 26.5 % — ABNORMAL LOW (ref 36.0–46.0)
Hemoglobin: 9.1 g/dL — ABNORMAL LOW (ref 12.0–15.0)
MCH: 30.4 pg (ref 26.0–34.0)
MCHC: 34.3 g/dL (ref 30.0–36.0)
MCV: 88.6 fL (ref 80.0–100.0)
Platelets: 147 K/uL — ABNORMAL LOW (ref 150–400)
RBC: 2.99 MIL/uL — ABNORMAL LOW (ref 3.87–5.11)
RDW: 12.7 % (ref 11.5–15.5)
WBC: 11.3 K/uL — ABNORMAL HIGH (ref 4.0–10.5)
nRBC: 0 % (ref 0.0–0.2)

## 2024-06-03 LAB — COMPREHENSIVE METABOLIC PANEL WITH GFR
ALT: 22 U/L (ref 0–44)
AST: 33 U/L (ref 15–41)
Albumin: 3.1 g/dL — ABNORMAL LOW (ref 3.5–5.0)
Alkaline Phosphatase: 121 U/L (ref 38–126)
Anion gap: 10 (ref 5–15)
BUN: 7 mg/dL (ref 6–20)
CO2: 19 mmol/L — ABNORMAL LOW (ref 22–32)
Calcium: 8.5 mg/dL — ABNORMAL LOW (ref 8.9–10.3)
Chloride: 104 mmol/L (ref 98–111)
Creatinine, Ser: 0.59 mg/dL (ref 0.44–1.00)
GFR, Estimated: >60 mL/min
Glucose, Bld: 112 mg/dL — ABNORMAL HIGH (ref 70–99)
Potassium: 4.1 mmol/L (ref 3.5–5.1)
Sodium: 133 mmol/L — ABNORMAL LOW (ref 135–145)
Total Bilirubin: 0.3 mg/dL (ref 0.0–1.2)
Total Protein: 5.1 g/dL — ABNORMAL LOW (ref 6.5–8.1)

## 2024-06-03 MED ORDER — ONDANSETRON HCL 4 MG/2ML IJ SOLN
INTRAMUSCULAR | Status: AC
  Administered 2024-06-03: 4 mg via INTRAVENOUS
  Filled 2024-06-03: qty 2

## 2024-06-03 MED ORDER — ONDANSETRON HCL 4 MG/2ML IJ SOLN
4.0000 mg | Freq: Four times a day (QID) | INTRAMUSCULAR | Status: DC | PRN
  Administered 2024-06-03 – 2024-06-04 (×2): 4 mg via INTRAVENOUS
  Filled 2024-06-03 (×2): qty 2

## 2024-06-03 MED ORDER — NIFEDIPINE ER OSMOTIC RELEASE 30 MG PO TB24
60.0000 mg | ORAL_TABLET | Freq: Once | ORAL | Status: AC
  Administered 2024-06-03: 60 mg via ORAL

## 2024-06-03 MED ORDER — LAMOTRIGINE 150 MG PO TABS: 150.0000 mg | ORAL_TABLET | Freq: Once | ORAL | Status: DC

## 2024-06-03 NOTE — Progress Notes (Signed)
 Orthostatic BP was done on Pt and BP were elevated. RN checked again and BP was still elevated. Pt is asymptomatic. MD was made aware and stated we should continue to monitor.

## 2024-06-03 NOTE — Progress Notes (Signed)
 Subjective: Postpartum Day 1: Cesarean Delivery Patient reports incisional pain, tolerating PO, + flatus, and no problems voiding. She admits to concern for nausea given history of being sensitive to narcotics - causes nausea/vomiting. Requests zofran . She also reports mild low back pain especially when getting in/out of bed. Pumping/breastfeeding. Denies fever, chills SOB or CP. Bonding well with daughter Ruther    Objective: Vital signs in last 24 hours: Temp:  [97.8 F (36.6 C)-98.8 F (37.1 C)] 98.3 F (36.8 C) (12/22 0100) Pulse Rate:  [60-80] 70 (12/22 0000) Resp:  [13-27] 20 (12/22 0500) BP: (131-152)/(62-87) 132/74 (12/22 0100) SpO2:  [98 %-100 %] 100 % (12/22 0500)  Physical Exam:  General: alert, cooperative, no distress, and slightly anxious  Lochia: appropriate Uterine Fundus: firm Incision: no significant drainage DVT Evaluation: No evidence of DVT seen on physical exam.  Recent Labs    06/02/24 1612 06/03/24 0556  HGB 11.6* 9.1*  HCT 34.5* 26.5*    Assessment/Plan: 38yo [id number] female on POD#1 s/p primary c/s for malpresentation with acute fetal heart rate deceleration following epidural prior to version attempt Post op care: ordered IV zofran  to be given with oxycodone  for pain relief. Will either transition to oral zofran  or switch to tramadol based on pt response to med. Abdominal binder ordered. Routine post op care  Chronic hypertension/hx preE in previous pregnancies - on nifedipine  60xl at bedtime now. Will monitor BP q 4hrs and adjust meds accordingly. Anxiety - not taking lamictal . Aware available. Hydroxyzine  prn  Acute blood loss anemia - no clinical significance as pt asymptomatic. Iron  supp daily ordered  Benign gestational thrombocytopenia - plts stable ? 100K ( 147K) Routine pp care - possible discharge to home tomorrow with stable BPs.      Ted LELON Solo, DO 06/03/2024, 12:01 PM

## 2024-06-03 NOTE — Progress Notes (Addendum)
 MOB was referred for history of ADD/anxiety.  * Referral screened out by Clinical Social Worker because none of the following criteria appear to apply:  ~ History of anxiety/ADD during this pregnancy, or of post-partum depression following prior delivery.  ~ Diagnosis of anxiety and ADD within last 3 years  OR  * MOB's symptoms currently being treated with medication and/or therapy.  Per OB notes, has an active prescription Lamictal 150mg  for support.  Edinburgh score 5  Please contact the Clinical Social Worker if needs arise, by [facility] request, or if MOB scores greater than 9/yes to question 10 on Edinburgh Postpartum Depression Screen.   Per chart review, SDOH determined housing difficulties. CSW met MOB at bedside to complete assessment and offer support. CSW entered the room, introduced herself and acknowledged her family was present. CSW asked MOB for privacy reasons could her family step out for the assessment; MOB was agreeable and her guest stepped out. CSW explained her role and the reason for the visit.  CSW asked MOB about SDOH determining housing difficulties. MOB reported currently renting 2 houses one in [place] and the other in [place]. MOB reported her husband currently works in recruitment consultant and her family is currently residing in [place]. MOB reported currently paying over 5000 for a her houses; however her homes are safe and stable.   CSW asked MOB how she was currently feeling. MOB reported she had 3 vaginal births and this last birth resulted with a C-section. MOB reported the C-section was different however she is feeling good. MOB reported currently prescribed Lamictal and PRN with her therapist she has built rapport with for over 7 years. MOB reported her mental health as stable and feeling excited for the infant.  Rosina Molt, [place] Clinical Social Worker [phone]

## 2024-06-04 ENCOUNTER — Encounter (HOSPITAL_COMMUNITY): Payer: Self-pay | Admitting: Obstetrics and Gynecology

## 2024-06-04 LAB — COMPREHENSIVE METABOLIC PANEL WITH GFR
ALT: 28 U/L (ref 0–44)
AST: 40 U/L (ref 15–41)
Albumin: 3 g/dL — ABNORMAL LOW (ref 3.5–5.0)
Alkaline Phosphatase: 104 U/L (ref 38–126)
Anion gap: 7 (ref 5–15)
BUN: 7 mg/dL (ref 6–20)
CO2: 24 mmol/L (ref 22–32)
Calcium: 8.4 mg/dL — ABNORMAL LOW (ref 8.9–10.3)
Chloride: 108 mmol/L (ref 98–111)
Creatinine, Ser: 0.73 mg/dL (ref 0.44–1.00)
GFR, Estimated: >60 mL/min
Glucose, Bld: 83 mg/dL (ref 70–99)
Potassium: 4.1 mmol/L (ref 3.5–5.1)
Sodium: 138 mmol/L (ref 135–145)
Total Bilirubin: <0.2 mg/dL (ref 0.0–1.2)
Total Protein: 4.9 g/dL — ABNORMAL LOW (ref 6.5–8.1)

## 2024-06-04 LAB — SURGICAL PATHOLOGY

## 2024-06-04 LAB — CBC
HCT: 22.7 % — ABNORMAL LOW (ref 36.0–46.0)
Hemoglobin: 7.7 g/dL — ABNORMAL LOW (ref 12.0–15.0)
MCH: 30.8 pg (ref 26.0–34.0)
MCHC: 33.9 g/dL (ref 30.0–36.0)
MCV: 90.8 fL (ref 80.0–100.0)
Platelets: 122 K/uL — ABNORMAL LOW (ref 150–400)
RBC: 2.5 MIL/uL — ABNORMAL LOW (ref 3.87–5.11)
RDW: 13.2 % (ref 11.5–15.5)
WBC: 7.6 K/uL (ref 4.0–10.5)
nRBC: 0 % (ref 0.0–0.2)

## 2024-06-04 LAB — SYPHILIS: RPR W/REFLEX TO RPR TITER AND TREPONEMAL ANTIBODIES, TRADITIONAL SCREENING AND DIAGNOSIS ALGORITHM: RPR Ser Ql: NONREACTIVE

## 2024-06-04 MED ORDER — ONDANSETRON HCL 4 MG PO TABS: 4.0000 mg | ORAL_TABLET | Freq: Four times a day (QID) | ORAL | 0 refills | Status: AC | PRN

## 2024-06-04 MED ORDER — NIFEDIPINE ER 30 MG PO TB24: 60.0000 mg | ORAL_TABLET | Freq: Every day | ORAL | 0 refills | Status: AC

## 2024-06-04 MED ORDER — IBUPROFEN 600 MG PO TABS: 600.0000 mg | ORAL_TABLET | Freq: Four times a day (QID) | ORAL | 0 refills | Status: AC

## 2024-06-04 MED ORDER — GABAPENTIN 100 MG PO CAPS: 100.0000 mg | ORAL_CAPSULE | Freq: Three times a day (TID) | ORAL | 0 refills | Status: AC

## 2024-06-04 MED ORDER — SODIUM CHLORIDE 0.9 % IV SOLN
500.0000 mg | Freq: Once | INTRAVENOUS | Status: AC
  Administered 2024-06-04: 500 mg via INTRAVENOUS
  Filled 2024-06-04: qty 25

## 2024-06-04 MED ORDER — EPINEPHRINE 0.3 MG/0.3ML IJ SOAJ: 0.3000 mg | Freq: Once | INTRAMUSCULAR | Status: DC | PRN

## 2024-06-04 MED ORDER — DIPHENHYDRAMINE HCL 50 MG/ML IJ SOLN: 25.0000 mg | Freq: Once | INTRAMUSCULAR | Status: DC | PRN

## 2024-06-04 MED ORDER — ALBUTEROL SULFATE (2.5 MG/3ML) 0.083% IN NEBU: 2.5000 mg | INHALATION_SOLUTION | Freq: Once | RESPIRATORY_TRACT | Status: DC | PRN

## 2024-06-04 MED ORDER — SODIUM CHLORIDE 0.9 % IV BOLUS: 500.0000 mL | Freq: Once | INTRAVENOUS | Status: DC | PRN

## 2024-06-04 MED ORDER — OXYCODONE HCL 5 MG PO TABS: 5.0000 mg | ORAL_TABLET | Freq: Four times a day (QID) | ORAL | 0 refills | Status: AC | PRN

## 2024-06-04 MED ORDER — ONDANSETRON HCL 4 MG PO TABS
4.0000 mg | ORAL_TABLET | Freq: Four times a day (QID) | ORAL | Status: DC | PRN
  Administered 2024-06-04: 4 mg via ORAL
  Filled 2024-06-04: qty 1

## 2024-06-04 MED ORDER — SODIUM CHLORIDE 0.9 % IV SOLN
INTRAVENOUS | Status: DC | PRN
  Administered 2024-06-04: 1 mL/h via INTRAVENOUS

## 2024-06-04 NOTE — Discharge Summary (Signed)
 "    Postpartum Discharge Summary  Date of Service updated 06/04/24     Patient Name: Hannah Hill DOB: 03-Dec-1985 MRN: [id number]  Date of admission: 05/31/2024 Delivery date:06/02/2024 Delivering provider: CLAIRE RAMAN A Date of discharge: 06/04/2024  Admitting diagnosis: Elevated blood pressure affecting pregnancy in third trimester, antepartum [O16.3] Chronic hypertension in pregnancy [O10.919] Intrauterine pregnancy: [redacted]w[redacted]d     Secondary diagnosis:  Principal Problem:   Elevated blood pressure affecting pregnancy in third trimester, antepartum Active Problems:   Chronic hypertension in pregnancy  Additional problems: Malpresentation    Discharge diagnosis: Preterm Pregnancy Delivered and CHTN                                              Post partum procedures:n/a Augmentation: N/A Complications: None  Hospital course: Patient was admitted at [redacted]w[redacted]d with acute worsening of her home CHTN in the setting of an upper respiratory infection.  She was admitted to [facility] to determine she was developing superimposed preeclampsia vs worsening CHTN.  Ultimately, her URI symptoms resolved, but she had worsening headaches.  Decision was made to proceed with delivery.  Infant was noted to be breech, so plan for ECV.  Epidural was placed prior to attempted ECV, but following epidural placement, fetal bradycardia occurred.   She then underwent a cesarean section with the following indication:Non-Reassuring FHR.Delivery details are as follows:  Membrane Rupture Time/Date:  ,   Delivery Method:C-Section, Low Transverse Operative Delivery:N/A Details of operation can be found in separate operative note.  Patient had a postpartum course complicated by ABLA for which she received IV iron  on the day of discharge.  She is ambulating, tolerating a regular diet, passing flatus, and urinating well. Patient is discharged home in stable condition on  06/04/2024        Newborn Data: Birth  date:06/02/2024 Birth time:7:44 PM Gender:Female Living status:Living Apgars:7 ,9  Weight:2960 g    Magnesium Sulfate received: No BMZ received: No Rhophylac:N/A MMR:No T-DaP:Given prenatally Flu: Yes RSV Vaccine received: Yes Transfusion:No Immunizations administered: Immunization History  Administered Date(s) Administered   PFIZER(Purple Top)SARS-COV-2 Vaccination 07/20/2019, 08/14/2019, 06/25/2020    Physical exam  Vitals:   06/03/24 2300 06/04/24 0450 06/04/24 1034 06/04/24 1056  BP: 135/81 116/63 112/70 117/75  Pulse: 64 66 80 71  Resp: 18 18 18    Temp: 98 F (36.7 C) 98 F (36.7 C) 98 F (36.7 C)   TempSrc: Oral Oral    SpO2: 99% 100% 100%   Weight:      Height:       General: alert, cooperative, and no distress Lochia: appropriate Uterine Fundus: firm Incision: Dressing is clean, dry, and intact DVT Evaluation: No evidence of DVT seen on physical exam. Labs: Lab Results  Component Value Date   WBC 7.6 06/04/2024   HGB 7.7 (L) 06/04/2024   HCT 22.7 (L) 06/04/2024   MCV 90.8 06/04/2024   PLT 122 (L) 06/04/2024      Latest Ref Rng & Units 06/04/2024    5:00 AM  CMP  Glucose 70 - 99 mg/dL 83   BUN 6 - 20 mg/dL 7   Creatinine 9.55 - 8.99 mg/dL 9.26   Sodium 864 - 854 mmol/L 138   Potassium 3.5 - 5.1 mmol/L 4.1   Chloride 98 - 111 mmol/L 108   CO2 22 - 32 mmol/L 24   Calcium   8.9 - 10.3 mg/dL 8.4   Total Protein 6.5 - 8.1 g/dL 4.9   Total Bilirubin 0.0 - 1.2 mg/dL <9.7   Alkaline Phos 38 - 126 U/L 104   AST 15 - 41 U/L 40   ALT 0 - 44 U/L 28    Edinburgh Score:    06/03/2024   11:45 AM  Edinburgh Postnatal Depression Scale Screening Tool  I have been able to laugh and see the funny side of things. 0  I have looked forward with enjoyment to things. 0  I have blamed myself unnecessarily when things went wrong. 2  I have been anxious or worried for no good reason. 2  I have felt scared or panicky for no good reason. 0  Things have been  getting on top of me. 1  I have been so unhappy that I have had difficulty sleeping. 0  I have felt sad or miserable. 0  I have been so unhappy that I have been crying. 0  The thought of harming myself has occurred to me. 0  Edinburgh Postnatal Depression Scale Total 5      After visit meds:  Allergies as of 06/04/2024       Reactions   Amoxicillin Hives, Swelling   Facial swelling Has patient had a PCN reaction causing immediate rash, facial/tongue/throat swelling, SOB or lightheadedness with hypotension: Yes Has patient had a PCN reaction causing severe rash involving mucus membranes or skin necrosis: No Has patient had a PCN reaction that required hospitalization: No Has patient had a PCN reaction occurring within the last 10 years: Yes If all of the above answers are NO, then may proceed with Cephalosporin use     Prednisone  Shortness Of Breath, Anxiety, Other (See Comments)   Respiratory Distress   Pantoprazole Other (See Comments)   Arthralgia (Joint Pain)   Betadine [povidone Iodine] Hives, Rash        Medication List     STOP taking these medications    ALPRAZolam 0.5 MG tablet Commonly known as: XANAX   Mirena (52 MG) 20 MCG/24HR Iud Generic drug: levonorgestrel   NIFEdipine  20 MG capsule Commonly known as: PROCARDIA  Replaced by: NIFEdipine  30 MG 24 hr tablet       TAKE these medications    acetaminophen  325 MG tablet Commonly known as: Tylenol  Take 2 tablets (650 mg total) by mouth every 4 (four) hours as needed (for pain scale < 4).   gabapentin  100 MG capsule Commonly known as: NEURONTIN  Take 1 capsule (100 mg total) by mouth 3 (three) times daily.   ibuprofen  600 MG tablet Commonly known as: ADVIL  Take 1 tablet (600 mg total) by mouth every 6 (six) hours.   NIFEdipine  30 MG 24 hr tablet Commonly known as: ADALAT  CC Take 2 tablets (60 mg total) by mouth at bedtime. Replaces: NIFEdipine  20 MG capsule   ondansetron  4 MG  tablet Commonly known as: ZOFRAN  Take 1 tablet (4 mg total) by mouth every 6 (six) hours as needed for nausea or vomiting. What changed:  when to take this reasons to take this additional instructions   oxyCODONE  5 MG immediate release tablet Commonly known as: Oxy IR/ROXICODONE  Take 1 tablet (5 mg total) by mouth every 6 (six) hours as needed for severe pain (pain score 7-10).   prenatal multivitamin Tabs tablet Take 1 tablet by mouth daily.         Discharge home in stable condition Infant Feeding: Bottle and Breast Infant Disposition:home with mother  Discharge instruction: per After Visit Summary and Postpartum booklet. Activity: Advance as tolerated. Pelvic rest for 6 weeks.  Diet: routine diet Anticipated Birth Control: Unsure Postpartum Appointment:6 weeks Additional Postpartum F/U: BP check 1 week Future Appointments:No future appointments. Follow up Visit:      06/04/2024 [facility] GEFFEL GRETTA, MD   "

## 2024-06-04 NOTE — Addendum Note (Signed)
 Addendum  created 06/04/24 1526 by Jerrye Sharper, MD   Clinical Note Signed

## 2024-06-04 NOTE — Progress Notes (Signed)
 Patient is doing well.  She is tolerating PO, ambulating, voiding.  Pain is controlled--improved with adding in oxycodone .  Lochia is appropriate  Vitals:   06/03/24 1600 06/03/24 2115 06/03/24 2300 06/04/24 0450  BP: 134/84 122/80 135/81 116/63  Pulse: 66 (!) 58 64 66  Resp: 18 17 18 18   Temp: 98.1 F (36.7 C) 98.2 F (36.8 C) 98 F (36.7 C) 98 F (36.7 C)  TempSrc: Oral Oral Oral Oral  SpO2:  100% 99% 100%  Weight:      Height:        NAD Abdomen:  soft, appropriate tenderness, incisions intact and without erythema or drainage ext:    Symmetric, no edema bilaterally  Lab Results  Component Value Date   WBC 7.6 06/04/2024   HGB 7.7 (L) 06/04/2024   HCT 22.7 (L) 06/04/2024   MCV 90.8 06/04/2024   PLT 122 (L) 06/04/2024    --/--/O POS (12/19 2210)  A/P    38 y.o. [id number] POD #2 s/p primary cesarean section for fetal bradycardia in the setting of worsening CHTN  Routine post op and postpartum care.   CHTN: BPs well controlled yesterday on home nifedipine  Xl 60 mg daily--plan BP check in 1 week Anxiety: not taking home lamictal ; hydroxyzine  available prn ABLA: hgb 7.7 from starting hemoglobin of 12.  Clinically significant to this hospitalization.  Discussed options--desires IV iron  replacement prior to discharge Meeting all goals--desires discharge today if baby able to be discharged

## 2024-06-04 NOTE — Lactation Note (Signed)
 This note was copied from a baby's chart. Lactation Consultation Note  Patient Name: Hannah Hill Unijb'd Date: 06/04/2024 Age:40 hours Reason for consult: Follow-up assessment;Late-preterm 34-36.6wks (change in weight loss -2.70% to -8.61%) .MOB had breast reduction 7 years ago. See MOB: MR-C/S delivery, AMA, CHTN and infant breech.   P4, MOB is expressing 3 mls of colostrum when pumping. LC entered the room, Parents were having SLP consult with infant feeding assessment. MOB will follow SLP feeding guidelines with feeding instructions, infant was given Dr. Orlinda preemie nipple.  MOB feeding plan: Day 2-LPTI 1- Feed infant every 3 hours and limit total feedings to 30 minutes or less to reserve  infant's energy. 2- MOB will bottle feed infant first any EBM/ formula Day 2 offer 21 mls or more  each feeding, afterwards briefly latch infant 5 minutes, then stop. 3- MOB will continue to use the DEBP every 3 hours for 15 minutes.  4-MOB knows to call for latch assistance if needed. Maternal Data    Feeding Mother's Current Feeding Choice: Breast Milk and Formula  LATCH Score                    Lactation Tools Discussed/Used Reason for Pumping: Infant is LPTI Pumping frequency: MOB will continue to pump every 3 hours for 15 minutes Pumped volume: 3 mL  Interventions Interventions: Expressed milk;DEBP;Education  Discharge    Consult Status Consult Status: Follow-up Date: 06/05/24 Follow-up type: In-patient    Hannah Hill 06/04/2024, 5:05 PM

## 2024-06-04 NOTE — Addendum Note (Signed)
 Addendum  created 06/04/24 0736 by Jerrye Sharper, MD   Intraprocedure Event deleted, Intraprocedure Event edited, Intraprocedure Staff edited

## 2024-06-04 NOTE — Anesthesia Postprocedure Evaluation (Signed)
"   Anesthesia Post Note  Patient: Hannah Hill  Procedure(s) Performed: CESAREAN DELIVERY (Abdomen)     Patient location during evaluation: PACU Anesthesia Type: Epidural Level of consciousness: oriented and awake and alert Pain management: pain level controlled Vital Signs Assessment: post-procedure vital signs reviewed and stable Respiratory status: spontaneous breathing, respiratory function stable and patient connected to nasal cannula oxygen Cardiovascular status: blood pressure returned to baseline and stable Postop Assessment: no headache, no backache, no apparent nausea or vomiting, epidural receding and patient able to bend at knees Anesthetic complications: no   No notable events documented.                 Shearon Clonch A.      "

## 2024-06-04 NOTE — Progress Notes (Signed)
 Patient ID: Hannah Hill, female   DOB: 15-Apr-1986, 38 y.o.   MRN: [id number]  Chart check   BP well controlled 122-135/80-84) over past 12 hrs

## 2024-06-04 NOTE — Addendum Note (Signed)
 Addendum  created 06/04/24 1523 by Jerrye Sharper, MD   Intraprocedure Event edited

## 2024-06-04 NOTE — Addendum Note (Signed)
 Addendum  created 06/04/24 1524 by Jerrye Sharper, MD   Intraprocedure Event deleted

## 2024-06-10 ENCOUNTER — Telehealth (HOSPITAL_COMMUNITY): Payer: Self-pay | Admitting: *Deleted

## 2024-06-10 NOTE — Telephone Encounter (Signed)
 06/10/2024  Name: Hannah Hill MRN: [id number] DOB: 12/29/1985  Reason for Call:  Transition of Care Hospital Discharge Call  Contact Status: Patient Contact Status: Complete  Language assistant needed: Interpreter Mode: Interpreter Not Needed        Follow-Up Questions: Do You Have Any Concerns About Your Health As You Heal From Delivery?: Yes What Concerns Do You Have About Your Health?: Patient states that she is having gas discomfort and is passing gas but not much. Patient also states she has not had a bowel movement since she was discharged from the hospital. Patient denies nausea or vomiting. Patient states she has an appointment this afternoon with her provider. Encouraged patient to keep that appointment to discuss her concerns with her provider. Do You Have Any Concerns About Your Infants Health?: No  Edinburgh Postnatal Depression Scale:  In the Past 7 Days: I have been able to laugh and see the funny side of things.: As much as I always could I have looked forward with enjoyment to things.: As much as I ever did I have blamed myself unnecessarily when things went wrong.: Yes, some of the time I have been anxious or worried for no good reason.: No, not at all I have felt scared or panicky for no good reason.: No, not at all Things have been getting on top of me.: No, I have been coping as well as ever I have been so unhappy that I have had difficulty sleeping.: Not at all I have felt sad or miserable.: No, not at all I have been so unhappy that I have been crying.: No, never The thought of harming myself has occurred to me.: Never Edinburgh Postnatal Depression Scale Total: 2  PHQ2-9 Depression Scale:     Discharge Follow-up: Edinburgh score requires follow up?: No Patient was advised of the following resources:: Breastfeeding Support Group, Support Group  Post-discharge interventions: Reviewed Newborn Safe Sleep Practices  Steva Tammy PEAK   06/10/2024 1127

## 2024-06-11 ENCOUNTER — Encounter (HOSPITAL_COMMUNITY)
